# Patient Record
Sex: Female | Born: 1960 | Race: White | Hispanic: No | Marital: Married | Smoking: Never smoker
Health system: Southern US, Community
[De-identification: ages and names within clinical notes are randomized; demographics above are authoritative.]

## PROBLEM LIST (undated history)

## (undated) DIAGNOSIS — I214 Non-ST elevation (NSTEMI) myocardial infarction: Secondary | ICD-10-CM

## (undated) DIAGNOSIS — I10 Essential (primary) hypertension: Secondary | ICD-10-CM

## (undated) DIAGNOSIS — E039 Hypothyroidism, unspecified: Secondary | ICD-10-CM

## (undated) DIAGNOSIS — C50919 Malignant neoplasm of unspecified site of unspecified female breast: Secondary | ICD-10-CM

## (undated) DIAGNOSIS — J189 Pneumonia, unspecified organism: Secondary | ICD-10-CM

## (undated) DIAGNOSIS — C181 Malignant neoplasm of appendix: Secondary | ICD-10-CM

## (undated) DIAGNOSIS — D690 Allergic purpura: Secondary | ICD-10-CM

## (undated) DIAGNOSIS — Z87442 Personal history of urinary calculi: Secondary | ICD-10-CM

## (undated) DIAGNOSIS — R519 Headache, unspecified: Secondary | ICD-10-CM

## (undated) DIAGNOSIS — M810 Age-related osteoporosis without current pathological fracture: Secondary | ICD-10-CM

## (undated) HISTORY — PX: OOPHORECTOMY: SHX86

## (undated) HISTORY — DX: Allergic purpura: D69.0

## (undated) HISTORY — PX: APPENDECTOMY: SHX54

## (undated) HISTORY — DX: Hypothyroidism, unspecified: E03.9

## (undated) HISTORY — DX: Non-ST elevation (NSTEMI) myocardial infarction: I21.4

## (undated) HISTORY — PX: OTHER SURGICAL HISTORY: SHX169

## (undated) HISTORY — DX: Age-related osteoporosis without current pathological fracture: M81.0

---

## 1994-04-30 HISTORY — PX: BREAST BIOPSY: SHX20

## 1998-06-30 ENCOUNTER — Other Ambulatory Visit: Admission: RE | Admit: 1998-06-30 | Discharge: 1998-06-30 | Payer: Self-pay | Admitting: Gynecology

## 1999-03-29 ENCOUNTER — Other Ambulatory Visit: Admission: RE | Admit: 1999-03-29 | Discharge: 1999-03-29 | Payer: Self-pay | Admitting: Gynecology

## 1999-06-12 ENCOUNTER — Encounter: Payer: Self-pay | Admitting: Gynecology

## 1999-06-12 ENCOUNTER — Encounter: Admission: RE | Admit: 1999-06-12 | Discharge: 1999-06-12 | Payer: Self-pay | Admitting: Gynecology

## 1999-06-28 ENCOUNTER — Other Ambulatory Visit: Admission: RE | Admit: 1999-06-28 | Discharge: 1999-06-28 | Payer: Self-pay | Admitting: Gynecology

## 2000-06-13 ENCOUNTER — Encounter: Admission: RE | Admit: 2000-06-13 | Discharge: 2000-06-13 | Payer: Self-pay | Admitting: Gynecology

## 2000-06-13 ENCOUNTER — Encounter: Payer: Self-pay | Admitting: Gynecology

## 2000-07-04 ENCOUNTER — Other Ambulatory Visit: Admission: RE | Admit: 2000-07-04 | Discharge: 2000-07-04 | Payer: Self-pay | Admitting: Gynecology

## 2001-04-30 HISTORY — PX: BREAST LUMPECTOMY: SHX2

## 2001-06-23 ENCOUNTER — Encounter: Admission: RE | Admit: 2001-06-23 | Discharge: 2001-06-23 | Payer: Self-pay | Admitting: Gynecology

## 2001-06-23 ENCOUNTER — Encounter: Payer: Self-pay | Admitting: Gynecology

## 2001-06-26 ENCOUNTER — Other Ambulatory Visit: Admission: RE | Admit: 2001-06-26 | Discharge: 2001-06-26 | Payer: Self-pay | Admitting: Radiology

## 2001-06-26 ENCOUNTER — Encounter: Admission: RE | Admit: 2001-06-26 | Discharge: 2001-06-26 | Payer: Self-pay | Admitting: Gynecology

## 2001-06-26 ENCOUNTER — Encounter: Payer: Self-pay | Admitting: Gynecology

## 2001-06-26 ENCOUNTER — Encounter (INDEPENDENT_AMBULATORY_CARE_PROVIDER_SITE_OTHER): Payer: Self-pay | Admitting: *Deleted

## 2001-06-26 DIAGNOSIS — Z853 Personal history of malignant neoplasm of breast: Secondary | ICD-10-CM

## 2001-07-07 ENCOUNTER — Other Ambulatory Visit: Admission: RE | Admit: 2001-07-07 | Discharge: 2001-07-07 | Payer: Self-pay | Admitting: Gynecology

## 2001-07-07 ENCOUNTER — Encounter: Admission: RE | Admit: 2001-07-07 | Discharge: 2001-07-07 | Payer: Self-pay | Admitting: Surgery

## 2001-07-07 ENCOUNTER — Encounter: Payer: Self-pay | Admitting: Surgery

## 2001-07-10 ENCOUNTER — Encounter (INDEPENDENT_AMBULATORY_CARE_PROVIDER_SITE_OTHER): Payer: Self-pay | Admitting: *Deleted

## 2001-07-10 ENCOUNTER — Ambulatory Visit (HOSPITAL_BASED_OUTPATIENT_CLINIC_OR_DEPARTMENT_OTHER): Admission: RE | Admit: 2001-07-10 | Discharge: 2001-07-10 | Payer: Self-pay | Admitting: Surgery

## 2001-07-10 ENCOUNTER — Encounter: Payer: Self-pay | Admitting: Surgery

## 2001-07-10 ENCOUNTER — Encounter: Admission: RE | Admit: 2001-07-10 | Discharge: 2001-07-10 | Payer: Self-pay | Admitting: Surgery

## 2001-07-22 ENCOUNTER — Ambulatory Visit: Admission: RE | Admit: 2001-07-22 | Discharge: 2001-10-20 | Payer: Self-pay | Admitting: Radiation Oncology

## 2001-08-14 ENCOUNTER — Encounter: Payer: Self-pay | Admitting: Surgery

## 2001-08-14 ENCOUNTER — Ambulatory Visit (HOSPITAL_BASED_OUTPATIENT_CLINIC_OR_DEPARTMENT_OTHER): Admission: RE | Admit: 2001-08-14 | Discharge: 2001-08-14 | Payer: Self-pay | Admitting: Surgery

## 2001-09-16 ENCOUNTER — Inpatient Hospital Stay (HOSPITAL_COMMUNITY): Admission: EM | Admit: 2001-09-16 | Discharge: 2001-09-18 | Payer: Self-pay | Admitting: Oncology

## 2001-10-07 ENCOUNTER — Inpatient Hospital Stay (HOSPITAL_COMMUNITY): Admission: AD | Admit: 2001-10-07 | Discharge: 2001-10-09 | Payer: Self-pay | Admitting: Oncology

## 2001-10-07 ENCOUNTER — Encounter: Payer: Self-pay | Admitting: Oncology

## 2001-10-28 ENCOUNTER — Encounter: Payer: Self-pay | Admitting: Oncology

## 2001-10-28 ENCOUNTER — Inpatient Hospital Stay (HOSPITAL_COMMUNITY): Admission: AD | Admit: 2001-10-28 | Discharge: 2001-10-30 | Payer: Self-pay | Admitting: Oncology

## 2001-11-04 ENCOUNTER — Ambulatory Visit: Admission: RE | Admit: 2001-11-04 | Discharge: 2002-01-22 | Payer: Self-pay | Admitting: Radiation Oncology

## 2001-11-12 ENCOUNTER — Ambulatory Visit (HOSPITAL_BASED_OUTPATIENT_CLINIC_OR_DEPARTMENT_OTHER): Admission: RE | Admit: 2001-11-12 | Discharge: 2001-11-12 | Payer: Self-pay | Admitting: Surgery

## 2001-11-19 ENCOUNTER — Encounter: Admission: RE | Admit: 2001-11-19 | Discharge: 2001-11-19 | Payer: Self-pay | Admitting: Radiation Oncology

## 2001-11-26 ENCOUNTER — Encounter: Admission: RE | Admit: 2001-11-26 | Discharge: 2001-11-26 | Payer: Self-pay | Admitting: Surgery

## 2001-11-26 ENCOUNTER — Encounter: Payer: Self-pay | Admitting: Surgery

## 2001-12-15 ENCOUNTER — Encounter: Admission: RE | Admit: 2001-12-15 | Discharge: 2001-12-15 | Payer: Self-pay | Admitting: Radiation Oncology

## 2002-02-18 ENCOUNTER — Ambulatory Visit: Admission: RE | Admit: 2002-02-18 | Discharge: 2002-02-18 | Payer: Self-pay | Admitting: Radiation Oncology

## 2002-02-19 ENCOUNTER — Ambulatory Visit: Admission: RE | Admit: 2002-02-19 | Discharge: 2002-02-19 | Payer: Self-pay | Admitting: Radiation Oncology

## 2002-07-02 ENCOUNTER — Encounter: Admission: RE | Admit: 2002-07-02 | Discharge: 2002-07-02 | Payer: Self-pay | Admitting: Oncology

## 2002-07-02 ENCOUNTER — Encounter: Payer: Self-pay | Admitting: Oncology

## 2002-07-29 ENCOUNTER — Other Ambulatory Visit: Admission: RE | Admit: 2002-07-29 | Discharge: 2002-07-29 | Payer: Self-pay | Admitting: Gynecology

## 2003-01-18 ENCOUNTER — Encounter: Payer: Self-pay | Admitting: Oncology

## 2003-01-18 ENCOUNTER — Encounter: Admission: RE | Admit: 2003-01-18 | Discharge: 2003-01-18 | Payer: Self-pay | Admitting: Oncology

## 2003-07-08 ENCOUNTER — Encounter: Admission: RE | Admit: 2003-07-08 | Discharge: 2003-07-08 | Payer: Self-pay | Admitting: Oncology

## 2003-07-30 ENCOUNTER — Other Ambulatory Visit: Admission: RE | Admit: 2003-07-30 | Discharge: 2003-07-30 | Payer: Self-pay | Admitting: Gynecology

## 2004-05-31 ENCOUNTER — Ambulatory Visit: Payer: Self-pay | Admitting: Internal Medicine

## 2004-07-10 ENCOUNTER — Encounter: Admission: RE | Admit: 2004-07-10 | Discharge: 2004-07-10 | Payer: Self-pay | Admitting: Oncology

## 2004-07-13 ENCOUNTER — Ambulatory Visit: Payer: Self-pay | Admitting: Oncology

## 2004-07-27 ENCOUNTER — Encounter: Admission: RE | Admit: 2004-07-27 | Discharge: 2004-07-27 | Payer: Self-pay | Admitting: Oncology

## 2004-08-01 ENCOUNTER — Other Ambulatory Visit: Admission: RE | Admit: 2004-08-01 | Discharge: 2004-08-01 | Payer: Self-pay | Admitting: Gynecology

## 2004-08-08 ENCOUNTER — Ambulatory Visit (HOSPITAL_COMMUNITY): Admission: RE | Admit: 2004-08-08 | Discharge: 2004-08-08 | Payer: Self-pay | Admitting: Oncology

## 2004-10-18 ENCOUNTER — Ambulatory Visit: Payer: Self-pay | Admitting: Internal Medicine

## 2004-11-16 ENCOUNTER — Ambulatory Visit: Payer: Self-pay | Admitting: Internal Medicine

## 2005-01-22 ENCOUNTER — Ambulatory Visit: Payer: Self-pay | Admitting: Oncology

## 2005-03-21 ENCOUNTER — Ambulatory Visit: Payer: Self-pay | Admitting: Internal Medicine

## 2005-07-11 ENCOUNTER — Encounter: Admission: RE | Admit: 2005-07-11 | Discharge: 2005-07-11 | Payer: Self-pay | Admitting: Oncology

## 2005-08-06 ENCOUNTER — Other Ambulatory Visit: Admission: RE | Admit: 2005-08-06 | Discharge: 2005-08-06 | Payer: Self-pay | Admitting: Gynecology

## 2005-08-08 ENCOUNTER — Ambulatory Visit: Payer: Self-pay | Admitting: Oncology

## 2005-08-08 LAB — CBC WITH DIFFERENTIAL/PLATELET
Basophils Absolute: 0 10*3/uL (ref 0.0–0.1)
Eosinophils Absolute: 0.1 10*3/uL (ref 0.0–0.5)
HGB: 11.5 g/dL — ABNORMAL LOW (ref 11.6–15.9)
MCV: 77.2 fL — ABNORMAL LOW (ref 81.0–101.0)
MONO%: 8.1 % (ref 0.0–13.0)
NEUT#: 2 10*3/uL (ref 1.5–6.5)
Platelets: 223 10*3/uL (ref 145–400)
RDW: 19 % — ABNORMAL HIGH (ref 11.3–14.5)

## 2005-08-08 LAB — COMPREHENSIVE METABOLIC PANEL
Albumin: 4.2 g/dL (ref 3.5–5.2)
CO2: 25 mEq/L (ref 19–32)
Calcium: 9 mg/dL (ref 8.4–10.5)
Chloride: 107 mEq/L (ref 96–112)
Glucose, Bld: 97 mg/dL (ref 70–99)
Potassium: 4.4 mEq/L (ref 3.5–5.3)
Sodium: 140 mEq/L (ref 135–145)
Total Bilirubin: 0.5 mg/dL (ref 0.3–1.2)
Total Protein: 7.1 g/dL (ref 6.0–8.3)

## 2005-08-08 LAB — LIPID PANEL: Cholesterol: 195 mg/dL (ref 0–200)

## 2005-08-08 LAB — URINALYSIS, MICROSCOPIC - CHCC
Bilirubin (Urine): NEGATIVE
Blood: NEGATIVE
Leukocyte Esterase: NEGATIVE
pH: 6.5 (ref 4.6–8.0)

## 2005-08-08 LAB — TSH: TSH: 3.105 u[IU]/mL (ref 0.350–5.500)

## 2005-08-30 ENCOUNTER — Encounter (INDEPENDENT_AMBULATORY_CARE_PROVIDER_SITE_OTHER): Payer: Self-pay | Admitting: Specialist

## 2005-08-30 ENCOUNTER — Ambulatory Visit (HOSPITAL_BASED_OUTPATIENT_CLINIC_OR_DEPARTMENT_OTHER): Admission: RE | Admit: 2005-08-30 | Discharge: 2005-08-30 | Payer: Self-pay | Admitting: Gynecology

## 2005-10-01 ENCOUNTER — Ambulatory Visit: Payer: Self-pay | Admitting: Oncology

## 2005-10-03 LAB — CBC WITH DIFFERENTIAL/PLATELET
Basophils Absolute: 0 10*3/uL (ref 0.0–0.1)
EOS%: 6.2 % (ref 0.0–7.0)
Eosinophils Absolute: 0.3 10*3/uL (ref 0.0–0.5)
HGB: 12 g/dL (ref 11.6–15.9)
NEUT#: 2.4 10*3/uL (ref 1.5–6.5)
RDW: 17.6 % — ABNORMAL HIGH (ref 11.3–14.5)
WBC: 4.8 10*3/uL (ref 3.9–10.0)
lymph#: 1.6 10*3/uL (ref 0.9–3.3)

## 2005-11-14 ENCOUNTER — Encounter: Admission: RE | Admit: 2005-11-14 | Discharge: 2005-11-14 | Payer: Self-pay | Admitting: Oncology

## 2005-12-03 ENCOUNTER — Ambulatory Visit: Payer: Self-pay | Admitting: Oncology

## 2005-12-05 LAB — CBC WITH DIFFERENTIAL/PLATELET
Basophils Absolute: 0 10*3/uL (ref 0.0–0.1)
EOS%: 4.1 % (ref 0.0–7.0)
Eosinophils Absolute: 0.2 10*3/uL (ref 0.0–0.5)
HCT: 38.4 % (ref 34.8–46.6)
HGB: 13.1 g/dL (ref 11.6–15.9)
MCH: 28.5 pg (ref 26.0–34.0)
MONO#: 0.3 10*3/uL (ref 0.1–0.9)
NEUT%: 54.2 % (ref 39.6–76.8)
lymph#: 1.3 10*3/uL (ref 0.9–3.3)

## 2005-12-07 ENCOUNTER — Ambulatory Visit: Payer: Self-pay | Admitting: Internal Medicine

## 2006-04-01 ENCOUNTER — Ambulatory Visit: Payer: Self-pay | Admitting: Oncology

## 2006-04-03 LAB — CBC WITH DIFFERENTIAL/PLATELET
BASO%: 0.4 % (ref 0.0–2.0)
Basophils Absolute: 0 10*3/uL (ref 0.0–0.1)
HCT: 38.3 % (ref 34.8–46.6)
HGB: 13.1 g/dL (ref 11.6–15.9)
MCHC: 34.2 g/dL (ref 32.0–36.0)
MONO#: 0.5 10*3/uL (ref 0.1–0.9)
NEUT%: 52.8 % (ref 39.6–76.8)
WBC: 5.8 10*3/uL (ref 3.9–10.0)
lymph#: 2 10*3/uL (ref 0.9–3.3)

## 2006-04-30 HISTORY — PX: TOTAL ABDOMINAL HYSTERECTOMY: SHX209

## 2006-05-31 ENCOUNTER — Ambulatory Visit: Payer: Self-pay | Admitting: Internal Medicine

## 2006-06-24 ENCOUNTER — Ambulatory Visit (HOSPITAL_COMMUNITY): Admission: RE | Admit: 2006-06-24 | Discharge: 2006-06-24 | Payer: Self-pay | Admitting: Gynecology

## 2006-07-15 ENCOUNTER — Encounter: Admission: RE | Admit: 2006-07-15 | Discharge: 2006-07-15 | Payer: Self-pay | Admitting: Oncology

## 2006-07-19 ENCOUNTER — Encounter: Admission: RE | Admit: 2006-07-19 | Discharge: 2006-07-19 | Payer: Self-pay | Admitting: Oncology

## 2006-08-12 ENCOUNTER — Encounter (INDEPENDENT_AMBULATORY_CARE_PROVIDER_SITE_OTHER): Payer: Self-pay | Admitting: Specialist

## 2006-08-12 ENCOUNTER — Ambulatory Visit (HOSPITAL_COMMUNITY): Admission: RE | Admit: 2006-08-12 | Discharge: 2006-08-13 | Payer: Self-pay | Admitting: Gynecology

## 2006-09-30 ENCOUNTER — Ambulatory Visit: Payer: Self-pay | Admitting: Oncology

## 2006-10-02 LAB — COMPREHENSIVE METABOLIC PANEL
ALT: 11 U/L (ref 0–35)
Albumin: 4.3 g/dL (ref 3.5–5.2)
CO2: 26 mEq/L (ref 19–32)
Calcium: 9.6 mg/dL (ref 8.4–10.5)
Chloride: 106 mEq/L (ref 96–112)
Glucose, Bld: 87 mg/dL (ref 70–99)
Potassium: 3.9 mEq/L (ref 3.5–5.3)
Sodium: 142 mEq/L (ref 135–145)
Total Protein: 7.3 g/dL (ref 6.0–8.3)

## 2006-10-02 LAB — CBC WITH DIFFERENTIAL/PLATELET
BASO%: 1.2 % (ref 0.0–2.0)
Eosinophils Absolute: 0.2 10*3/uL (ref 0.0–0.5)
LYMPH%: 42.9 % (ref 14.0–48.0)
MCHC: 34.7 g/dL (ref 32.0–36.0)
MONO#: 0.5 10*3/uL (ref 0.1–0.9)
NEUT#: 2.6 10*3/uL (ref 1.5–6.5)
Platelets: 245 10*3/uL (ref 145–400)
RBC: 4.46 10*6/uL (ref 3.70–5.32)
WBC: 5.9 10*3/uL (ref 3.9–10.0)
lymph#: 2.5 10*3/uL (ref 0.9–3.3)

## 2006-12-17 ENCOUNTER — Ambulatory Visit: Payer: Self-pay | Admitting: Internal Medicine

## 2007-01-02 ENCOUNTER — Ambulatory Visit: Payer: Self-pay | Admitting: Internal Medicine

## 2007-03-31 ENCOUNTER — Ambulatory Visit: Payer: Self-pay | Admitting: Oncology

## 2007-04-02 LAB — COMPREHENSIVE METABOLIC PANEL
ALT: 10 U/L (ref 0–35)
Albumin: 4.3 g/dL (ref 3.5–5.2)
Alkaline Phosphatase: 84 U/L (ref 39–117)
CO2: 24 mEq/L (ref 19–32)
Glucose, Bld: 88 mg/dL (ref 70–99)
Potassium: 3.9 mEq/L (ref 3.5–5.3)
Sodium: 140 mEq/L (ref 135–145)
Total Bilirubin: 0.4 mg/dL (ref 0.3–1.2)
Total Protein: 7.4 g/dL (ref 6.0–8.3)

## 2007-04-02 LAB — CBC WITH DIFFERENTIAL/PLATELET
BASO%: 0.8 % (ref 0.0–2.0)
Eosinophils Absolute: 0.2 10*3/uL (ref 0.0–0.5)
LYMPH%: 43.5 % (ref 14.0–48.0)
MCHC: 34.9 g/dL (ref 32.0–36.0)
MONO#: 0.5 10*3/uL (ref 0.1–0.9)
NEUT#: 2.2 10*3/uL (ref 1.5–6.5)
Platelets: 228 10*3/uL (ref 145–400)
RBC: 4.55 10*6/uL (ref 3.70–5.32)
RDW: 13.8 % (ref 11.3–14.5)
WBC: 5.2 10*3/uL (ref 3.9–10.0)

## 2007-07-18 ENCOUNTER — Other Ambulatory Visit: Admission: RE | Admit: 2007-07-18 | Discharge: 2007-07-18 | Payer: Self-pay | Admitting: Diagnostic Radiology

## 2007-07-18 ENCOUNTER — Encounter (INDEPENDENT_AMBULATORY_CARE_PROVIDER_SITE_OTHER): Payer: Self-pay | Admitting: Diagnostic Radiology

## 2007-07-18 ENCOUNTER — Encounter: Admission: RE | Admit: 2007-07-18 | Discharge: 2007-07-18 | Payer: Self-pay | Admitting: Oncology

## 2007-08-13 ENCOUNTER — Ambulatory Visit: Payer: Self-pay | Admitting: Internal Medicine

## 2007-09-29 ENCOUNTER — Ambulatory Visit: Payer: Self-pay | Admitting: Oncology

## 2007-10-01 LAB — CBC WITH DIFFERENTIAL/PLATELET
Basophils Absolute: 0 10*3/uL (ref 0.0–0.1)
HCT: 39.7 % (ref 34.8–46.6)
HGB: 13.4 g/dL (ref 11.6–15.9)
MONO#: 0.5 10*3/uL (ref 0.1–0.9)
NEUT%: 55.2 % (ref 39.6–76.8)
WBC: 5.1 10*3/uL (ref 3.9–10.0)
lymph#: 1.6 10*3/uL (ref 0.9–3.3)

## 2007-10-01 LAB — COMPREHENSIVE METABOLIC PANEL
BUN: 11 mg/dL (ref 6–23)
CO2: 23 mEq/L (ref 19–32)
Calcium: 9.6 mg/dL (ref 8.4–10.5)
Chloride: 106 mEq/L (ref 96–112)
Creatinine, Ser: 0.75 mg/dL (ref 0.40–1.20)
Glucose, Bld: 89 mg/dL (ref 70–99)

## 2007-12-16 ENCOUNTER — Ambulatory Visit: Payer: Self-pay | Admitting: Internal Medicine

## 2008-01-12 ENCOUNTER — Encounter: Admission: RE | Admit: 2008-01-12 | Discharge: 2008-01-12 | Payer: Self-pay | Admitting: Oncology

## 2008-04-05 ENCOUNTER — Ambulatory Visit: Payer: Self-pay | Admitting: Oncology

## 2008-04-07 LAB — CBC WITH DIFFERENTIAL/PLATELET
EOS%: 3.6 % (ref 0.0–7.0)
Eosinophils Absolute: 0.2 10*3/uL (ref 0.0–0.5)
MCV: 85.2 fL (ref 81.0–101.0)
MONO%: 7.6 % (ref 0.0–13.0)
NEUT#: 2.1 10*3/uL (ref 1.5–6.5)
RBC: 4.44 10*6/uL (ref 3.70–5.32)
RDW: 14.6 % — ABNORMAL HIGH (ref 11.3–14.5)
WBC: 4.6 10*3/uL (ref 3.9–10.0)

## 2008-04-07 LAB — COMPREHENSIVE METABOLIC PANEL
AST: 15 U/L (ref 0–37)
BUN: 11 mg/dL (ref 6–23)
CO2: 27 mEq/L (ref 19–32)
Calcium: 9.2 mg/dL (ref 8.4–10.5)
Chloride: 105 mEq/L (ref 96–112)
Creatinine, Ser: 0.77 mg/dL (ref 0.40–1.20)

## 2008-04-21 ENCOUNTER — Ambulatory Visit: Payer: Self-pay | Admitting: Internal Medicine

## 2008-04-21 DIAGNOSIS — J209 Acute bronchitis, unspecified: Secondary | ICD-10-CM

## 2008-07-12 ENCOUNTER — Telehealth (INDEPENDENT_AMBULATORY_CARE_PROVIDER_SITE_OTHER): Payer: Self-pay | Admitting: *Deleted

## 2008-07-13 ENCOUNTER — Ambulatory Visit: Payer: Self-pay | Admitting: Internal Medicine

## 2008-07-21 ENCOUNTER — Encounter: Admission: RE | Admit: 2008-07-21 | Discharge: 2008-07-21 | Payer: Self-pay | Admitting: Oncology

## 2008-08-09 ENCOUNTER — Ambulatory Visit: Payer: Self-pay | Admitting: Internal Medicine

## 2008-08-18 ENCOUNTER — Encounter: Admission: RE | Admit: 2008-08-18 | Discharge: 2008-08-18 | Payer: Self-pay | Admitting: Oncology

## 2008-09-23 ENCOUNTER — Ambulatory Visit: Payer: Self-pay | Admitting: Oncology

## 2008-09-28 LAB — CBC WITH DIFFERENTIAL/PLATELET
Eosinophils Absolute: 0.1 10*3/uL (ref 0.0–0.5)
MONO#: 0.4 10*3/uL (ref 0.1–0.9)
NEUT#: 2.5 10*3/uL (ref 1.5–6.5)
RBC: 4.62 10*6/uL (ref 3.70–5.45)
RDW: 14.4 % (ref 11.2–14.5)
WBC: 4.8 10*3/uL (ref 3.9–10.3)
lymph#: 1.8 10*3/uL (ref 0.9–3.3)

## 2008-09-28 LAB — COMPREHENSIVE METABOLIC PANEL
Albumin: 4.1 g/dL (ref 3.5–5.2)
Alkaline Phosphatase: 95 U/L (ref 39–117)
CO2: 24 mEq/L (ref 19–32)
Chloride: 105 mEq/L (ref 96–112)
Glucose, Bld: 98 mg/dL (ref 70–99)
Potassium: 4.2 mEq/L (ref 3.5–5.3)
Sodium: 141 mEq/L (ref 135–145)
Total Protein: 7.1 g/dL (ref 6.0–8.3)

## 2009-04-18 ENCOUNTER — Ambulatory Visit: Payer: Self-pay | Admitting: Oncology

## 2009-04-20 LAB — CBC WITH DIFFERENTIAL/PLATELET
BASO%: 0.5 % (ref 0.0–2.0)
Basophils Absolute: 0 10e3/uL (ref 0.0–0.1)
EOS%: 2.6 % (ref 0.0–7.0)
Eosinophils Absolute: 0.1 10e3/uL (ref 0.0–0.5)
HCT: 41.5 % (ref 34.8–46.6)
HGB: 14 g/dL (ref 11.6–15.9)
LYMPH%: 33.1 % (ref 14.0–49.7)
MCH: 29.1 pg (ref 25.1–34.0)
MCHC: 33.8 g/dL (ref 31.5–36.0)
MCV: 86.1 fL (ref 79.5–101.0)
MONO#: 0.5 10e3/uL (ref 0.1–0.9)
MONO%: 9.2 % (ref 0.0–14.0)
NEUT#: 2.8 10e3/uL (ref 1.5–6.5)
NEUT%: 54.6 % (ref 38.4–76.8)
Platelets: 231 10e3/uL (ref 145–400)
RBC: 4.82 10e6/uL (ref 3.70–5.45)
RDW: 14.4 % (ref 11.2–14.5)
WBC: 5.2 10e3/uL (ref 3.9–10.3)
lymph#: 1.7 10e3/uL (ref 0.9–3.3)

## 2009-04-20 LAB — LIPID PANEL
Total CHOL/HDL Ratio: 3.7 Ratio
VLDL: 26 mg/dL (ref 0–40)

## 2009-04-20 LAB — COMPREHENSIVE METABOLIC PANEL WITH GFR
ALT: 15 U/L (ref 0–35)
AST: 15 U/L (ref 0–37)
Albumin: 4.2 g/dL (ref 3.5–5.2)
Alkaline Phosphatase: 101 U/L (ref 39–117)
BUN: 14 mg/dL (ref 6–23)
CO2: 23 meq/L (ref 19–32)
Calcium: 9.6 mg/dL (ref 8.4–10.5)
Chloride: 104 meq/L (ref 96–112)
Creatinine, Ser: 0.74 mg/dL (ref 0.40–1.20)
Glucose, Bld: 101 mg/dL — ABNORMAL HIGH (ref 70–99)
Potassium: 4.3 meq/L (ref 3.5–5.3)
Sodium: 140 meq/L (ref 135–145)
Total Bilirubin: 0.5 mg/dL (ref 0.3–1.2)
Total Protein: 7.4 g/dL (ref 6.0–8.3)

## 2009-07-22 ENCOUNTER — Encounter: Admission: RE | Admit: 2009-07-22 | Discharge: 2009-07-22 | Payer: Self-pay | Admitting: Oncology

## 2009-08-16 ENCOUNTER — Ambulatory Visit: Payer: Self-pay | Admitting: Internal Medicine

## 2009-10-13 ENCOUNTER — Ambulatory Visit: Payer: Self-pay | Admitting: Oncology

## 2009-10-17 LAB — CBC WITH DIFFERENTIAL/PLATELET
BASO%: 0.5 % (ref 0.0–2.0)
Basophils Absolute: 0 10*3/uL (ref 0.0–0.1)
EOS%: 2.8 % (ref 0.0–7.0)
HCT: 37.9 % (ref 34.8–46.6)
HGB: 13 g/dL (ref 11.6–15.9)
MONO#: 0.5 10*3/uL (ref 0.1–0.9)
MONO%: 8.2 % (ref 0.0–14.0)
NEUT#: 3.5 10*3/uL (ref 1.5–6.5)
NEUT%: 60 % (ref 38.4–76.8)
RBC: 4.41 10*6/uL (ref 3.70–5.45)
RDW: 13.8 % (ref 11.2–14.5)

## 2009-10-17 LAB — COMPREHENSIVE METABOLIC PANEL
Alkaline Phosphatase: 87 U/L (ref 39–117)
Chloride: 106 mEq/L (ref 96–112)
Creatinine, Ser: 1.02 mg/dL (ref 0.40–1.20)
Potassium: 4.2 mEq/L (ref 3.5–5.3)
Sodium: 140 mEq/L (ref 135–145)
Total Bilirubin: 0.3 mg/dL (ref 0.3–1.2)

## 2010-04-03 ENCOUNTER — Ambulatory Visit: Payer: Self-pay | Admitting: Oncology

## 2010-04-04 LAB — CBC WITH DIFFERENTIAL/PLATELET
BASO%: 0.7 % (ref 0.0–2.0)
HCT: 39.8 % (ref 34.8–46.6)
HGB: 13.6 g/dL (ref 11.6–15.9)
LYMPH%: 32.3 % (ref 14.0–49.7)
MCV: 84.1 fL (ref 79.5–101.0)
MONO#: 0.4 10*3/uL (ref 0.1–0.9)
NEUT#: 2.8 10*3/uL (ref 1.5–6.5)
Platelets: 226 10*3/uL (ref 145–400)
RBC: 4.73 10*6/uL (ref 3.70–5.45)
RDW: 14.3 % (ref 11.2–14.5)
lymph#: 1.6 10*3/uL (ref 0.9–3.3)

## 2010-04-04 LAB — COMPREHENSIVE METABOLIC PANEL
ALT: 12 U/L (ref 0–35)
AST: 14 U/L (ref 0–37)
Alkaline Phosphatase: 100 U/L (ref 39–117)
BUN: 12 mg/dL (ref 6–23)
CO2: 27 mEq/L (ref 19–32)
Calcium: 9 mg/dL (ref 8.4–10.5)
Chloride: 105 mEq/L (ref 96–112)
Creatinine, Ser: 0.76 mg/dL (ref 0.40–1.20)
Glucose, Bld: 100 mg/dL — ABNORMAL HIGH (ref 70–99)
Sodium: 139 mEq/L (ref 135–145)
Total Bilirubin: 0.5 mg/dL (ref 0.3–1.2)

## 2010-05-20 ENCOUNTER — Other Ambulatory Visit: Payer: Self-pay | Admitting: Oncology

## 2010-05-20 DIAGNOSIS — Z1239 Encounter for other screening for malignant neoplasm of breast: Secondary | ICD-10-CM

## 2010-06-01 NOTE — Assessment & Plan Note (Signed)
Summary: 12 months/apc   Primary Provider/Referring Provider:  Kindl  CC:  Yearly follow up visit-couple of episodes over the past year of breathing troubles. .  History of Present Illness: April 21, 2008 --c/o sinus congestion with clear drainage, sinus pressure, PND, sore throat, prod cough with yellow mucous since 04-04-08 - has finished zpak and OTC therapy-still not better.Finished zpack on 12/13. Stopped allergy vaccine 7/09.   07/13/08- Allergic rhinitis, bronchitis Was well through rest of winter after December visit. 4 days ago onset sore throat, shifting nasal congestion, sneezing, coughing up yellow. No fever. Right ear whistles when she blows her nose.  gi ok. Avoided nasal steroid for maintenance.  08/08/08- Allergic rhinitis, bronchitis After treatment last time, she did start alavert and nasonex. She feels much better and husband tells her she is snoring much less.  Chest comfortable and she is pleased with her status. Airconditioning at home also helps.  August 16, 2009 Allergic rhinitis, bronchitis Mainly uncomfortable with nasal congestion lying down x 4 weeks. Maxillary headaches. Little mucus from nose and  not much cough. Using Nasonex and Claritin. Not sneezing or wheezing.    Current Medications (verified): 1)  Calcium Carbonate 600 Mg  Tabs (Calcium Carbonate) .... Take 1 Two Times A Day 2)  Multivitamins   Tabs (Multiple Vitamin) .... Take 1 By Mouth Once Daily 3)  Vitamin E 400 Unit  Caps (Vitamin E) .... Take 1 By Mouth Once Daily 4)  Magnesium Gluconate 500 Mg  Tabs (Magnesium Gluconate) .... Take 1000mg  Daily 5)  Vitamin D 2000 Unit  Tabs (Cholecalciferol) .... Take 3000mg  Once Daily 6)  Arimidex 1 Mg  Tabs (Anastrozole) .... Take 1 By Mouth Once Daily 7)  Alavert 10 Mg  Tabs (Loratadine) .... Take 1 Tablet By Mouth Once A Day As Needed 8)  Nasonex 50 Mcg/act Susp (Mometasone Furoate) .Marland Kitchen.. 1-2 Sprays Each Nostril Daily  Allergies (verified): 1)  ! Pcn 2)   ! * Right Arm Only For Blood Pressure Readings  Past History:  Past Medical History: Last updated: 12/16/2007 Breast cancer- left lumpectomy chemo/ xrt 2003 Allergic rhinitis  Past Surgical History: Last updated: 12/16/2007 left breast lumpectomy TAH 2008- has appendix  Family History: Last updated: Dec 23, 2007 Mother-deceased age 42; breast cancer; DM, Allergies,Clotting Disorder, Arthritis. Father- deceased age 69; Vehicle accident Sibling 1- deceased age 60; heart attack Sibling 2- living age 52; stroke (2)  Social History: Last updated: 12/23/2007 Patient never smoked.  Positive history of passive tobacco smoke exposure.  Exercise- walk 7 times week Caffeine- 2-3 cups daily. ETOH-2 glasses per month Married with 1 child.  Risk Factors: Smoking Status: never (2007/12/23) Passive Smoke Exposure: yes (23-Dec-2007)  Review of Systems      See HPI       The patient complains of headaches.  The patient denies anorexia, fever, weight loss, weight gain, vision loss, decreased hearing, hoarseness, chest pain, syncope, dyspnea on exertion, peripheral edema, prolonged cough, hemoptysis, and severe indigestion/heartburn.    Vital Signs:  Patient profile:   50 year old female Height:      67 inches Weight:      215.13 pounds BMI:     33.82 O2 Sat:      95 % on Room air Pulse rate:   96 / minute BP sitting:   136 / 90  (left arm) Cuff size:   large  Vitals Entered By: Reynaldo Minium CMA (August 16, 2009 9:55 AM)  O2 Flow:  Room air  Physical  Exam  Additional Exam:  General: A/Ox3; pleasant and cooperative, NAD, SKIN: no rash, lesions NODES: no lymphadenopathy HEENT: [place]/AT, EOM-wnl, Conjunctivae clear, watery, PERRLA, TMs- WNL, Nose- pale turbinate edema, mucus bridging, Throat- Mallampati  III NECK: Supple w/ fair ROM, JVD- none, normal carotid impulses w/o bruits ,   CHEST: Coarse BS bilaterally, no wheezing HEART: RRR, no m/g/r heard Abdomen: soft NEURO: Grossly  intact to observation      Impression & Recommendations:  Problem # 1:  ALLERGIC RHINITIS (ICD-477.9)  Seasonal allergic rhinitis. She is cautious using decongestants and steroids which we discussed. Her updated medication list for this problem includes:    Alavert 10 Mg Tabs (Loratadine) .Marland Kitchen... Take 1 tablet by mouth once a day as needed    Nasonex 50 Mcg/act Susp (Mometasone furoate) .Marland Kitchen... 1-2 sprays each nostril daily    Astepro 0.15 % Soln (Azelastine hcl) .Marland Kitchen... 1-2 sprays each nostril up to twice daily as needed  Medications Added to Medication List This Visit: 1)  Vitamin D 2000 Unit Tabs (Cholecalciferol) .... Take 3000mg  once daily 2)  Astepro 0.15 % Soln (Azelastine hcl) .Marland Kitchen.. 1-2 sprays each nostril up to twice daily as needed  Other Orders: Est. Patient Level III (16109)  Patient Instructions: 1)  Please schedule a follow-up appointment in 1 year. 2)  Sample/ script Astepro nasal antihistamine spray:  3)  1-2 puffs each nostril up to twice daily if needed. 4)  Keep on using Nasonex. During allergy season you can try using it twicde daily. 5)  You can use a decongestant like Sudafed or Sudafed-PE (otc) as needed Prescriptions: NASONEX 50 MCG/ACT SUSP (MOMETASONE FUROATE) 1-2 sprays each nostril daily  #1 x prn   Entered and Authorized by:   Waymon Budge MD   Signed by:   Waymon Budge MD on 08/16/2009   Method used:   Print then Give to Patient   RxID:   [id number] ASTEPRO 0.15 % SOLN (AZELASTINE HCL) 1-2 sprays each nostril up to twice daily as needed  #1 x prn   Entered and Authorized by:   Waymon Budge MD   Signed by:   Waymon Budge MD on 08/16/2009   Method used:   Print then Give to Patient   RxID:   [id number]

## 2010-06-22 ENCOUNTER — Emergency Department (HOSPITAL_BASED_OUTPATIENT_CLINIC_OR_DEPARTMENT_OTHER)
Admission: EM | Admit: 2010-06-22 | Discharge: 2010-06-23 | Payer: 59 | Source: Home / Self Care | Attending: Emergency Medicine | Admitting: Emergency Medicine

## 2010-06-22 DIAGNOSIS — Z853 Personal history of malignant neoplasm of breast: Secondary | ICD-10-CM | POA: Insufficient documentation

## 2010-06-22 DIAGNOSIS — R0609 Other forms of dyspnea: Secondary | ICD-10-CM | POA: Insufficient documentation

## 2010-06-22 DIAGNOSIS — R0989 Other specified symptoms and signs involving the circulatory and respiratory systems: Secondary | ICD-10-CM | POA: Insufficient documentation

## 2010-06-22 DIAGNOSIS — R079 Chest pain, unspecified: Secondary | ICD-10-CM

## 2010-06-22 DIAGNOSIS — Z8249 Family history of ischemic heart disease and other diseases of the circulatory system: Secondary | ICD-10-CM | POA: Insufficient documentation

## 2010-06-22 DIAGNOSIS — R110 Nausea: Secondary | ICD-10-CM | POA: Insufficient documentation

## 2010-06-22 DIAGNOSIS — R0602 Shortness of breath: Secondary | ICD-10-CM | POA: Insufficient documentation

## 2010-06-22 DIAGNOSIS — R61 Generalized hyperhidrosis: Secondary | ICD-10-CM | POA: Insufficient documentation

## 2010-06-23 ENCOUNTER — Inpatient Hospital Stay (HOSPITAL_COMMUNITY)
Admission: AD | Admit: 2010-06-23 | Discharge: 2010-06-24 | DRG: 282 | Disposition: A | Discharge status: Home / Self Care | Payer: 59 | Source: Other Acute Inpatient Hospital | Attending: Cardiology | Admitting: Cardiology

## 2010-06-23 ENCOUNTER — Inpatient Hospital Stay (HOSPITAL_COMMUNITY): Payer: 59

## 2010-06-23 ENCOUNTER — Emergency Department (INDEPENDENT_AMBULATORY_CARE_PROVIDER_SITE_OTHER): Payer: 59

## 2010-06-23 ENCOUNTER — Encounter (HOSPITAL_COMMUNITY): Payer: Self-pay | Admitting: Radiology

## 2010-06-23 DIAGNOSIS — R079 Chest pain, unspecified: Secondary | ICD-10-CM

## 2010-06-23 DIAGNOSIS — E785 Hyperlipidemia, unspecified: Secondary | ICD-10-CM | POA: Diagnosis present

## 2010-06-23 DIAGNOSIS — I10 Essential (primary) hypertension: Secondary | ICD-10-CM | POA: Diagnosis present

## 2010-06-23 DIAGNOSIS — R0602 Shortness of breath: Secondary | ICD-10-CM

## 2010-06-23 DIAGNOSIS — I214 Non-ST elevation (NSTEMI) myocardial infarction: Principal | ICD-10-CM | POA: Diagnosis present

## 2010-06-23 DIAGNOSIS — I201 Angina pectoris with documented spasm: Secondary | ICD-10-CM | POA: Diagnosis present

## 2010-06-23 DIAGNOSIS — Z853 Personal history of malignant neoplasm of breast: Secondary | ICD-10-CM

## 2010-06-23 HISTORY — DX: Malignant neoplasm of unspecified site of unspecified female breast: C50.919

## 2010-06-23 LAB — COMPREHENSIVE METABOLIC PANEL
AST: 26 U/L (ref 0–37)
Albumin: 3.3 g/dL — ABNORMAL LOW (ref 3.5–5.2)
BUN: 12 mg/dL (ref 6–23)
CO2: 25 mEq/L (ref 19–32)
Calcium: 8.9 mg/dL (ref 8.4–10.5)
Creatinine, Ser: 0.78 mg/dL (ref 0.4–1.2)
GFR calc Af Amer: >60 mL/min (ref >60)
GFR calc non Af Amer: >60 mL/min (ref >60)

## 2010-06-23 LAB — HEPARIN LEVEL (UNFRACTIONATED): Heparin Unfractionated: <0.1 IU/mL — ABNORMAL LOW (ref 0.30–0.70)

## 2010-06-23 LAB — CBC
HCT: 36.4 % (ref 36.0–46.0)
Hemoglobin: 11.9 g/dL — ABNORMAL LOW (ref 12.0–15.0)
MCH: 28.1 pg (ref 26.0–34.0)
MCHC: 33.8 g/dL (ref 30.0–36.0)
Platelets: 222 10*3/uL (ref 150–400)
RDW: 13.9 % (ref 11.5–15.5)
WBC: 7.1 10*3/uL (ref 4.0–10.5)
WBC: 8.7 10*3/uL (ref 4.0–10.5)

## 2010-06-23 LAB — POCT TOXICOLOGY PANEL

## 2010-06-23 LAB — URINALYSIS, ROUTINE W REFLEX MICROSCOPIC
Protein, ur: NEGATIVE mg/dL
Specific Gravity, Urine: 1.017 (ref 1.005–1.030)
Urine Glucose, Fasting: NEGATIVE mg/dL
pH: 7 (ref 5.0–8.0)

## 2010-06-23 LAB — URINE MICROSCOPIC-ADD ON

## 2010-06-23 LAB — CARDIAC PANEL(CRET KIN+CKTOT+MB+TROPI)
CK, MB: 27.2 ng/mL (ref 0.3–4.0)
CK, MB: 94.8 ng/mL (ref 0.3–4.0)
Relative Index: 11.7 — ABNORMAL HIGH (ref 0.0–2.5)
Relative Index: 13.3 — ABNORMAL HIGH (ref 0.0–2.5)
Total CK: 232 U/L — ABNORMAL HIGH (ref 7–177)
Total CK: 711 U/L — ABNORMAL HIGH (ref 7–177)

## 2010-06-23 LAB — PROTIME-INR
INR: 1.03 (ref 0.00–1.49)
Prothrombin Time: 13.7 seconds (ref 11.6–15.2)

## 2010-06-23 LAB — DIFFERENTIAL
Basophils Relative: 1 % (ref 0–1)
Eosinophils Absolute: 0.2 10*3/uL (ref 0.0–0.7)
Lymphocytes Relative: 41 % (ref 12–46)
Monocytes Absolute: 0.7 10*3/uL (ref 0.1–1.0)

## 2010-06-23 LAB — POCT CARDIAC MARKERS: Myoglobin, poc: 39.5 ng/mL (ref 12–200)

## 2010-06-23 LAB — BASIC METABOLIC PANEL
CO2: 23 mEq/L (ref 19–32)
GFR calc Af Amer: >60 mL/min (ref >60)
GFR calc non Af Amer: >60 mL/min (ref >60)
Glucose, Bld: 125 mg/dL — ABNORMAL HIGH (ref 70–99)

## 2010-06-23 LAB — BRAIN NATRIURETIC PEPTIDE: Pro B Natriuretic peptide (BNP): 38 pg/mL (ref 0.0–100.0)

## 2010-06-23 LAB — D-DIMER, QUANTITATIVE: D-Dimer, Quant: 0.24 ug/mL-FEU (ref 0.00–0.48)

## 2010-06-23 LAB — LIPID PANEL
Triglycerides: 70 mg/dL (ref <150)
VLDL: 14 mg/dL (ref 0–40)

## 2010-06-23 LAB — APTT: aPTT: 34 seconds (ref 24–37)

## 2010-06-23 MED ORDER — IOHEXOL 350 MG/ML SOLN
100.0000 mL | Freq: Once | INTRAVENOUS | Status: AC | PRN
  Administered 2010-06-23: 100 mL via INTRAVENOUS

## 2010-06-23 NOTE — Procedures (Signed)
NAME:  [name], [name]                  ACCOUNT NO.:  [id number]  MEDICAL RECORD NO.:  [id number]           PATIENT TYPE:  I  LOCATION:  2807                         FACILITY:  [facility]  PHYSICIAN:  Verne Carrow, MDDATE OF BIRTH:  1960/08/16  DATE OF PROCEDURE:  06/23/2010 DATE OF DISCHARGE:                           CARDIAC CATHETERIZATION   PRIMARY CARDIOLOGIST:  Luis Abed, MD, [facility]  PROCEDURES PERFORMED: 1. Left heart catheterization. 2. Selective coronary angiography. 3. Left ventricular angiogram.  OPERATOR:  Verne Carrow, MD.  ARTERIAL ACCESS SITE:  Right radial artery.  INDICATIONS:  This is a 50 year old Caucasian female with history of breast cancer and radiation, who had an episode of severe chest pain last night.  She was transferred to [facility] from [facility].  The patient had an abnormal EKG and her second set of cardiac enzymes showed a troponin of 0.7.  The patient was evaluated by Dr. Willa Rough this morning and based on her clinical presentation, diagnostic left heart catheterization was arranged.  PROCEDURE IN DETAIL:  The patient was brought to the main cardiac catheterization laboratory after signing informed consent for the procedure.  An Freida Busman test was performed on the right wrist and was positive.  The right wrist was prepped and draped in sterile fashion. Lidocaine 1% was used for local anesthesia.  A 5-French sheath was inserted into the right radial artery without difficulty.  Verapamil 3 mg was given through the sheath after insertion.  The patient was given 4000 units of intravenous heparin after sheath insertion.  Standard diagnostic catheters were used to perform selective coronary angiography.  A pigtail catheter was used to perform a left ventricular angiogram.  The patient tolerated the procedure well.  There were no immediate complications.  The sheath was removed from the right radial artery  and a Terumo hemostasis band was applied over the arteriotomy site.  The patient was taken to the holding area in stable condition.  HEMODYNAMIC FINDINGS:  Central aortic pressure 117/80.  Left ventricular pressure 115/15/0.  ANGIOGRAPHIC FINDINGS:  There was no angiographic evidence of coronary artery disease.  The left main artery bifurcated into the circumflex and the left anterior descending artery.  The left anterior descending was a large vessel that coursed to the apex and gave off a small-caliber diagonal branch from the mid vessel and a large-caliber bifurcating diagonal branch from the distal portion of the mid vessel.  The left anterior descending artery became relatively small caliber in its distal portion; however, it did wrap around the apex.  The circumflex artery gave off an early large obtuse marginal branch and a small-caliber second obtuse marginal branch.  There was no disease in this vessel. The right coronary artery is a large dominant vessel with no evidence of disease.  The left ventricular angiogram was performed in the RAO projection and showed normal left ventricular systolic function with ejection fraction of 65%.  IMPRESSION: 1. No angiographic evidence of coronary artery disease. 2. Normal left ventricular systolic function. 3. Questionable etiology of chest pain with mildly abnormal troponin     with no  evidence of pulmonary embolism.  In this situation, I would     consider coronary vasospasm as the cause of her chest pain and     abnormal cardiac enzymes.  RECOMMENDATIONS:  At this time, I would recommend medical management, which could include a calcium channel blocker for possible coronary vasospasm.  The patient will be watched closely tonight as she did have two significant contrast loads with her CAT scan earlier this morning followed by her cardiac catheterization today.  If her renal function is stable tomorrow and she is symptom free, I  would recommend discharge to home tomorrow.     Verne Carrow, MD     CM/MEDQ  D:  06/23/2010  T:  06/23/2010  Job:  [id number]  cc:   Luis Abed, MD, [facility]  Electronically Signed by Verne Carrow MD on 06/23/2010 01:12:24 PM

## 2010-06-23 NOTE — H&P (Signed)
NAME:  [name], [name]                  ACCOUNT NO.:  [id number]  MEDICAL RECORD NO.:  [id number]           PATIENT TYPE:  I  LOCATION:  2305                         FACILITY:  [facility]  PHYSICIAN:  Harlon Flor, MD   DATE OF BIRTH:  Feb 13, 1961  DATE OF ADMISSION:  06/23/2010 DATE OF DISCHARGE:                             HISTORY & PHYSICAL   PRIMARY CARE PHYSICIAN:  Gaspar Garbe, MD with [facility].  ADMISSION DIAGNOSIS:  Chest pain.  HISTORY OF PRESENT ILLNESS:  [name] is a pleasant 50 year old white female, who is transferred here from [facility] with chest pain and concern for EKG changes at that point.  She reports sharp substernal chest pain that began while she was throwing a ball with her children today.  The chest pain was severe radiating to her mid back and currently is only located in her back.  The pain was sharp.  She felt like she could not get comfortable.  It was not associated with shortness of breath or nausea or diaphoresis.  She was placed on heparin drip and a nitroglycerin drip, and due to concern for possible EKG changes, she was transferred to the ICU here at [facility].  On arrival, her chest pain is improved, but she continues to have mid subscapular pain that is sharp in nature.  PAST MEDICAL HISTORY: 1. Breast cancer in 2003, status post lumpectomy, chemo and radiation     as well as tamoxifen therapy. 2. Total abdominal hysterectomy.  MEDICATIONS:  None.  ALLERGIES:  PENICILLIN.  SOCIAL HISTORY:  She denies use of tobacco or alcohol.  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  FAMILY HISTORY:  Her mother had breast cancer.  She has multiple brothers that have acute MI in the 55s.  REVIEW OF SYSTEMS:  Full review of systems obtained and is negative except as noted in HPI.  PHYSICAL EXAMINATION:  VITAL SIGNS: Blood pressure is 145/85, pulse 85, respirations 16, temperature afebrile.  GENERAL:  No acute distress. HEENT:   Extraocular movements intact.  Oropharynx benign.  Nonicteric sclerae. NECK:  Supple. CARDIOVASCULAR:  Regular rate and rhythm without murmurs, rubs, or gallops. LUNGS:  Clear to auscultation bilaterally. ABDOMEN:  Soft, nontender, nondistended. EXTREMITIES:  No clubbing, cyanosis, or edema.  She has no tenderness to palpation along her back at any point. NEUROLOGIC:  Grossly afocal.  Moves all extremities well. LYMPHATIC:  No lymphadenopathy. SKIN:  No rashes.  EKG shows normal sinus rhythm with repolarization abnormalities.  White count of 7.1, hemoglobin 13, platelets 222.  Basic metabolic panel is unremarkable.  Troponin less than 0.05.  Chest x-ray is clear. Mediastinum is normal.  ASSESSMENT AND PLAN:  [name] is a pleasant 50 year old white female with risk factors for coronary artery disease that include previous radiation therapy as well as family history, who is transferred with somewhat atypical chest pain, sharp in nature, and now is located in her mid back.  She has had equal pulses bilaterally, and has been normotensive. 1. Chest pain:  She does have risk factors for coronary artery  disease.  We will continue her on aspirin and heparin drip and     start metoprolol 25 mg b.i.d.  We will follow her cardiac enzymes     and check her fasting lipids.  If she rules out, functional studies     likely order in the morning.  I suspect she will be able to     exercise and might even be a candidate for stress echo. 2. History of breast cancer:  This has been in remission.  She is     concerned about recurrence and if that could be causing some of her     pain and I agree.  Given the atypical nature of her pain and her history      of cancer, we will plan on getting a CTA scan of her chest     tonight to be sure there is no alternate explanation.     Harlon Flor, MD     MMB/MEDQ  D:  06/23/2010  T:  06/23/2010  Job:  [id number]  cc:   Gaspar Garbe,  M.D.  Electronically Signed by Meridee Score MD on 06/23/2010 02:23:13 PM

## 2010-06-24 LAB — CBC
MCH: 27.8 pg (ref 26.0–34.0)
MCHC: 33.3 g/dL (ref 30.0–36.0)
MCV: 83.6 fL (ref 78.0–100.0)
Platelets: 198 10*3/uL (ref 150–400)
RBC: 5 MIL/uL (ref 3.87–5.11)
RDW: 14.1 % (ref 11.5–15.5)

## 2010-06-24 LAB — BASIC METABOLIC PANEL
CO2: 23 mEq/L (ref 19–32)
GFR calc Af Amer: >60 mL/min (ref >60)
Glucose, Bld: 100 mg/dL — ABNORMAL HIGH (ref 70–99)

## 2010-07-08 ENCOUNTER — Encounter: Payer: Self-pay | Admitting: Cardiology

## 2010-07-17 ENCOUNTER — Encounter (HOSPITAL_COMMUNITY): Payer: 59

## 2010-07-19 ENCOUNTER — Encounter (HOSPITAL_COMMUNITY): Payer: 59

## 2010-07-20 NOTE — Discharge Summary (Signed)
NAME:  [name], [name]                  ACCOUNT NO.:  [id number]  MEDICAL RECORD NO.:  [id number]           PATIENT TYPE:  I  LOCATION:  6531                         FACILITY:  [facility]  PHYSICIAN:  Duke Salvia, MD, FACCDATE OF BIRTH:  Jun 03, 1960  DATE OF ADMISSION:  06/23/2010 DATE OF DISCHARGE:  06/24/2010                              DISCHARGE SUMMARY   PRIMARY CARDIOLOGIST:  Luis Abed, MD, [facility]  PRIMARY CARE PROVIDER:  Gaspar Garbe, MD  DISCHARGE DIAGNOSIS:  Non-ST-segment elevation myocardial infarction.  SECONDARY DIAGNOSES: 1. 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 2. Hypertension, newly diagnosed. 3. Hyperlipidemia, LDL of 123. 4. History of breast cancer, status post lumpectomy, chemotherapy,     radiation in 2003. 5. Status post total abdominal hysterectomy.  ALLERGIES:  PENICILLIN.  PROCEDURES: 1. Diagnostic cardiac catheterization revealing normal coronary    arteries and an EF of 65%. 2. CT angiography of the chest showing no evidence for pulmonary     embolus or aortic dissection.  Mild dependent atelectasis.  HISTORY OF PRESENT ILLNESS:  A 50 year old female with prior history of breast cancer in 2003, who was in her usual state of health until June 22, 2010 when she was outside playing with her children and had sudden onset of severe chest pain radiating to her mid back.  Pain was sharp in nature and was not associated with dyspnea, nausea, or diaphoresis.  Pain persisted for the latter part of the afternoon in the evening, and the patient eventually presented to the [facility] late in the evening with lab work drawn around midnight. Initially, point-of-care markers were negative.  The patient was transferred to [facility] for further evaluation.  HOSPITAL COURSE:  The patient was subsequently noted to rule in for non- ST-elevation MI, eventually peaking her CK at 711, MB at 94.8, troponin I of  5.19.  It should be noted that the patient underwent CT angiography of the chest that showed no evidence of pulmonary embolus or aortic dissection.  With elevated markers, decision was made to pursue catheterization, which took place on June 23, 2010 showing normal coronary arteries and EF of 65%.  In the absence of CAD, it was felt that the patient's presentation was most consistent with coronary vasospasm and we have therefore initiated calcium channel blocker therapy.  Further, we have discontinued aspirin and beta-blocker, which could potentially exacerbate vasospasm.  The patient has been ambulating without recurrent symptoms or limitations.  She is being seen by Cardiac Rehab and will be discharged home later today in good condition.  DISCHARGE LABS:  Hemoglobin 13.9, hematocrit 41.8, WBC 6.5, platelets 198.  Sodium 141, potassium 4.0, chloride 106, CO2 of 23, BUN 5, creatinine 0.71, glucose 100.  Total bilirubin 0.2, alkaline phosphatase 77, AST 26, ALT 19, total protein 6.5, albumin 3.3, calcium 9.3.  CK 711, MB 94.8, troponin I 5.19.  Total cholesterol 182, triglycerides 70, HDL 45, LDL 123.  TSH 3.816.  DISPOSITION:  The patient will be discharged home today in good condition.  FOLLOWUP PLANS AND APPOINTMENTS:  We will arrange for followup with Dr. Willa Rough in approximately 2 weeks.  She is asked to follow up with Dr. Wylene Simmer as previously scheduled.  DISCHARGE MEDICATIONS: 1. Aleve 220 mg q.12 h p.r.n. headaches. 2. Lipitor 10 mg at bedtime. 3. Norvasc 5 mg daily. 4. Calcium over the counter 1 tablet daily. 5. Claritin over the counter 1 tablet daily p.r.n. 6. Magnesium over the counter 1 tablet at bedtime. 7. Vitamin D over the counter 1 tablet daily.  OUTSTANDING LABS AND STUDIES:  The patient will require a followup lipids and LFTs in approximately 6 weeks given new statin therapy.  Duration of discharge encounter 60 minutes including physician  time.     Nicolasa Ducking, ANP   ______________________________ Duke Salvia, MD, [facility]    CB/MEDQ  D:  06/24/2010  T:  06/24/2010  Job:  [id number]  cc:   Gaspar Garbe, M.D.  Electronically Signed by Nicolasa Ducking ANP on 07/04/2010 04:09:33 PM Electronically Signed by Sherryl Manges MD [facility] on 07/20/2010 08:32:35 AM

## 2010-07-21 ENCOUNTER — Telehealth: Payer: Self-pay | Admitting: Cardiology

## 2010-07-21 ENCOUNTER — Encounter: Payer: Self-pay | Admitting: Cardiology

## 2010-07-21 ENCOUNTER — Ambulatory Visit (INDEPENDENT_AMBULATORY_CARE_PROVIDER_SITE_OTHER): Payer: 59 | Admitting: Cardiology

## 2010-07-21 DIAGNOSIS — E785 Hyperlipidemia, unspecified: Secondary | ICD-10-CM | POA: Insufficient documentation

## 2010-07-21 DIAGNOSIS — I214 Non-ST elevation (NSTEMI) myocardial infarction: Secondary | ICD-10-CM

## 2010-07-21 DIAGNOSIS — I10 Essential (primary) hypertension: Secondary | ICD-10-CM

## 2010-07-21 NOTE — Assessment & Plan Note (Signed)
The patient brought careful assessment of her blood pressures.  She has no blood pressure elevation while on medications.  At one point her systolic pressure was in the range of 110.  She decided to hold her amlodipine for close to one week.  She resumed her amlodipine.  No change in therapy.

## 2010-07-21 NOTE — Assessment & Plan Note (Addendum)
The patient's presentation was unusual.  She had significant pain and she had significant enzyme elevation.  Her CPK went as high as 711 and troponin 5.1.  It is presumed that she had spasm.  She has had slight discomfort at home since then.  I have discussed with her at great length the possibility of changing the doses of her medicines and possibly adding a nitrate.  However I feel it is most prudent today to continue the same dose of her medicines.  I cannot explain her ongoing chest discomfort.  It seems unusual that she has some pain on and off for a day at a time and then no pain for several days.  I've chosen not to pursue any further diagnostic studies at this point.  I will see her back for followup.  As part of today's evaluation I have carefully reviewed hospital records including her discharge summary and her catheterization report.  Have had extensive discussion with her about the concept of coronary spasm.

## 2010-07-21 NOTE — Telephone Encounter (Signed)
FMLA papers dropped off by pt from [facility] snet to [facility] for processing, pt also completed [employer] packet/Release of Information. 07/21/10/KM

## 2010-07-21 NOTE — Progress Notes (Signed)
HPI The patient is seen after hospitalization for a non-STEMI.  There had been no prior history of cardiac issues.  She presented to the hospital on June 23, 2010 with severe chest pain.  She had enzyme elevation.  End catheterization was done.  The catheter revealed no definite coronary disease.  It was felt that most likely she had had some coronary spasm.  She stabilized and was discharged home.  CT Scan of the chest revealed no pulmonary emboli.  Since being at home she has had some mild chest discomfort.  It occurs in the center of her chest and radiates to the left armpit.  This is similar to her original pain but with very minor intensity.  She says that it does not come on with exercise.  She also notes that when she gets it it comes and goes throughout a given day.  On the next day she might not have any. Allergies  Allergen Reactions  . Penicillins     REACTION: swelling    Current Outpatient Prescriptions  Medication Sig Dispense Refill  . amLODipine (NORVASC) 5 MG tablet Take 5 mg by mouth daily.        Marland Kitchen atorvastatin (LIPITOR) 10 MG tablet Take 10 mg by mouth daily.        . calcium carbonate (OS-CAL) 600 MG TABS Take 600 mg by mouth 2 (two) times daily with a meal.        . Cholecalciferol (VITAMIN D3) 2000 UNITS TABS Take 1.5 tablets by mouth daily.        Marland Kitchen loratadine (CLARITIN) 10 MG tablet Take 10 mg by mouth daily.        . magnesium gluconate (MAGONATE) 500 MG tablet Take 1,000 mg by mouth daily.        . naproxen sodium (ANAPROX) 220 MG tablet Take 220 mg by mouth as needed.        . vitamin E 400 UNIT capsule Take 400 Units by mouth daily.        Marland Kitchen DISCONTD: loratadine (ALAVERT) 10 MG tablet daily as needed.       Marland Kitchen DISCONTD: anastrozole (ARIMIDEX) 1 MG tablet Take 1 mg by mouth daily.        Marland Kitchen DISCONTD: Azelastine HCl (ASTEPRO) 0.15 % SOLN 1-2 sprays 2 (two) times daily as needed.        Marland Kitchen DISCONTD: mometasone (NASONEX) 50 MCG/ACT nasal spray 2 sprays by Nasal route  daily.        Marland Kitchen DISCONTD: Multiple Vitamin (MULTIVITAMIN) tablet Take 1 tablet by mouth daily.          History   Social History  . Marital Status: Married    Spouse Name: N/A    Number of Children: 1  . Years of Education: N/A   Occupational History  . SUPERVISOR [employer]   Social History Main Topics  . Smoking status: Never Smoker   . Smokeless tobacco: Not on file  . Alcohol Use: Not on file     2 per month  . Drug Use: Not on file  . Sexually Active: Not on file   Other Topics Concern  . Not on file   Social History Narrative  . No narrative on file    Family History  Problem Relation Age of Onset  . Cancer Mother     breast  . Diabetes Mother   . Arthritis Mother   . Clotting disorder Mother   . Heart attack  sibling  . Stroke      sibling    Past Medical History  Diagnosis Date  . Breast cancer     left breast ca, lumpectomy, chemotherapy, radiation.  2003  . Allergic rhinitis   . Non-STEMI (non-ST elevated myocardial infarction)     June 23, 2010, presumed vasospasm in the absence CAD at catheter  . Hypertension   . Dyslipidemia     Past Surgical History  Procedure Date  . Breast lumpectomy     Left  . Total abdominal hysterectomy 2008    ROS  Patient denies fever, chills, headache, sweats, rash, change in vision, change in hearing, cough, nausea vomiting, urinary symptoms.  All other systems are reviewed and are negative. PHYSICAL EXAM Patient is oriented to person time and place.  Affect is normal.  Head is atraumatic.  There is no xanthelasma.  No carotid bruits.  There is no jugular venous distention.  Lungs are clear.  Respiratory effort is not labored.  Cardiac exam reveals S1 and S2.  No clicks or significant murmurs.  The abdomen is soft.  There is no significant peripheral edema.  There are no musculoskeletal deformities. Filed Vitals:   07/21/10 1603  BP: 116/80  Pulse: 84  Resp: 12  Height: 5\' 7"  (1.702 m)    Weight: 219 lb (99.338 kg)    EKG  EKG is done today and reviewed by me.  There is mild decrease anterior R wave progression.  ASSESSMENT & PLAN

## 2010-07-21 NOTE — Assessment & Plan Note (Signed)
The patient's LDL was 123.  She is on a statin.  I explained to her the importance of her remaining on a statin for stabilization of the coronary endothelium.

## 2010-07-21 NOTE — Patient Instructions (Signed)
Your physician recommends that you schedule a follow-up appointment in: 8 weeks  

## 2010-07-24 ENCOUNTER — Encounter (HOSPITAL_COMMUNITY): Payer: 59 | Attending: Cardiology

## 2010-07-24 ENCOUNTER — Ambulatory Visit
Admission: RE | Admit: 2010-07-24 | Discharge: 2010-07-24 | Disposition: A | Discharge status: Home / Self Care | Payer: 59 | Source: Ambulatory Visit | Attending: Oncology | Admitting: Oncology

## 2010-07-24 ENCOUNTER — Ambulatory Visit (HOSPITAL_COMMUNITY): Payer: 59

## 2010-07-24 DIAGNOSIS — E785 Hyperlipidemia, unspecified: Secondary | ICD-10-CM | POA: Insufficient documentation

## 2010-07-24 DIAGNOSIS — Z5189 Encounter for other specified aftercare: Secondary | ICD-10-CM | POA: Insufficient documentation

## 2010-07-24 DIAGNOSIS — Z853 Personal history of malignant neoplasm of breast: Secondary | ICD-10-CM | POA: Insufficient documentation

## 2010-07-24 DIAGNOSIS — Z1239 Encounter for other screening for malignant neoplasm of breast: Secondary | ICD-10-CM

## 2010-07-24 DIAGNOSIS — I10 Essential (primary) hypertension: Secondary | ICD-10-CM | POA: Insufficient documentation

## 2010-07-24 DIAGNOSIS — I201 Angina pectoris with documented spasm: Secondary | ICD-10-CM | POA: Insufficient documentation

## 2010-07-24 DIAGNOSIS — I214 Non-ST elevation (NSTEMI) myocardial infarction: Secondary | ICD-10-CM | POA: Insufficient documentation

## 2010-07-25 ENCOUNTER — Other Ambulatory Visit: Payer: Self-pay | Admitting: Oncology

## 2010-07-25 DIAGNOSIS — R922 Inconclusive mammogram: Secondary | ICD-10-CM

## 2010-07-25 DIAGNOSIS — Z9889 Other specified postprocedural states: Secondary | ICD-10-CM

## 2010-07-26 ENCOUNTER — Encounter (HOSPITAL_COMMUNITY): Payer: 59

## 2010-07-28 ENCOUNTER — Encounter (HOSPITAL_COMMUNITY): Payer: 59

## 2010-07-31 ENCOUNTER — Encounter (HOSPITAL_COMMUNITY): Payer: 59 | Attending: Cardiology

## 2010-07-31 DIAGNOSIS — Z5189 Encounter for other specified aftercare: Secondary | ICD-10-CM | POA: Insufficient documentation

## 2010-07-31 DIAGNOSIS — Z853 Personal history of malignant neoplasm of breast: Secondary | ICD-10-CM | POA: Insufficient documentation

## 2010-07-31 DIAGNOSIS — I214 Non-ST elevation (NSTEMI) myocardial infarction: Secondary | ICD-10-CM | POA: Insufficient documentation

## 2010-07-31 DIAGNOSIS — I201 Angina pectoris with documented spasm: Secondary | ICD-10-CM | POA: Insufficient documentation

## 2010-07-31 DIAGNOSIS — E785 Hyperlipidemia, unspecified: Secondary | ICD-10-CM | POA: Insufficient documentation

## 2010-07-31 DIAGNOSIS — I10 Essential (primary) hypertension: Secondary | ICD-10-CM | POA: Insufficient documentation

## 2010-08-02 ENCOUNTER — Encounter (HOSPITAL_COMMUNITY): Payer: 59

## 2010-08-04 ENCOUNTER — Encounter (HOSPITAL_COMMUNITY): Payer: 59

## 2010-08-07 ENCOUNTER — Encounter (HOSPITAL_COMMUNITY): Payer: 59

## 2010-08-09 ENCOUNTER — Encounter (HOSPITAL_COMMUNITY): Payer: 59

## 2010-08-11 ENCOUNTER — Encounter (HOSPITAL_COMMUNITY): Payer: 59

## 2010-08-14 ENCOUNTER — Encounter (HOSPITAL_COMMUNITY): Payer: 59

## 2010-08-16 ENCOUNTER — Encounter: Payer: Self-pay | Admitting: Internal Medicine

## 2010-08-16 ENCOUNTER — Ambulatory Visit (INDEPENDENT_AMBULATORY_CARE_PROVIDER_SITE_OTHER): Payer: 59 | Admitting: Internal Medicine

## 2010-08-16 ENCOUNTER — Encounter (HOSPITAL_COMMUNITY): Payer: 59

## 2010-08-16 VITALS — BP 124/62 | HR 83 | Ht 67.0 in | Wt 222.2 lb

## 2010-08-16 DIAGNOSIS — J309 Allergic rhinitis, unspecified: Secondary | ICD-10-CM

## 2010-08-16 MED ORDER — IPRATROPIUM BROMIDE 0.06 % NA SOLN: NASAL | Status: DC

## 2010-08-16 NOTE — Patient Instructions (Signed)
Consider raising the head of your bed a little and using nasal strips for stuffiness at night  Try script ipratropium nasal spray before meals for the watery nose problem. Script sent. You can continue using Nasonex and antihistamines as needed.   Try fexofenadine 180 (24 hour)/ Allegra 180 as an antihistamine alternative to claritin

## 2010-08-16 NOTE — Assessment & Plan Note (Addendum)
Seasonal allergic rhinitis is managed with antihistamines and nasonex. We discussed comparison antihistamines and decongestants. She can try plain allegra. I suspect rhinorhea with meals is vasomotor and will let her try an anticholinergic nasal spray.

## 2010-08-16 NOTE — Progress Notes (Signed)
  Subjective:    Patient ID: [name], female    DOB: [birth year], 50 y.o.   MRN: [id number]  HPI 50 yoF followed for allergic rhinitis and bronchitis with complicating hx of breast cancer, MI, HTN. She complains of a chronic pattern of watery rhinorhea with meals, sounding separate from her seasonal rhinitis. Uses Nasonex for seasonal stuffy rhinitis, but trial Astepro made her too sleepy. Allegra-d works best, but with hx of MI she uses it very sparingly. Has encasings on bedding.  Review of Systems See HPI Constitutional:   No weight loss, night sweats,  Fevers, chills, fatigue, lassitude. HEENT:   No headaches,  Difficulty swallowing,  Tooth/dental problems,  Sore throat,                No sneezing, itching, ear ache. Admits- nasal congestion- especially when supine   CV:  No chest pain,  Orthopnea, PND, swelling in lower extremities, anasarca, dizziness, palpitations  GI  No heartburn, indigestion, abdominal pain, nausea, vomiting, diarrhea, change in bowel habits, loss of appetite  Resp: No shortness of breath with exertion or at rest.  No excess mucus, no productive cough,  No non-productive cough,  No coughing up of blood.  No change in color of mucus.  No wheezing.  No chest wall deformity  Skin: no rash or lesions.  GU: no dysuria, change in color of urine, no urgency or frequency.  No flank pain.  MS:  No joint pain or swelling.  No decreased range of motion.  No back pain.  Psych:  No change in mood or affect. No depression or anxiety.  No memory loss.      Objective:   Physical Exam General- Alert, Oriented, Affect-appropriate, Distress- none acute  overweight  Skin- rash-none, lesions- none, excoriation- none  Lymphadenopathy- none  Head- atraumatic  Eyes- Gross vision intact, PERRLA, conjunctivae clear secretions  Ears- Hearing normal canals, Tm L ,   R ,  Nose- Clear, No-  Septal dev, mucus, polyps, erosion, perforation. No obvious increased  discharge.  Throat- Mallampati II-III , mucosa clear , drainage- none, tonsils- atrophic  Neck- flexible , trachea midline, no stridor , thyroid nl, carotid no bruit  Chest - symmetrical excursion , unlabored     Heart/CV- RRR , no murmur , no gallop  , no rub, nl s1 s2                     - JVD- none , edema- none, stasis changes- none, varices- none     Lung- clear to P&A, wheeze- none, cough- none , dullness-none, rub- none. She can demonstrate voluntarily a vibratory cough.      Chest wall- Abd- tender-no, distended-no, bowel sounds-present, HSM- no  Br/ Gen/ Rectal- Not done, not indicated  Extrem- cyanosis- none, clubbing, none, atrophy- none, strength- nl  Neuro- grossly intact to observation         Assessment & Plan:

## 2010-08-18 ENCOUNTER — Encounter (HOSPITAL_COMMUNITY): Payer: 59

## 2010-08-18 ENCOUNTER — Ambulatory Visit
Admission: RE | Admit: 2010-08-18 | Discharge: 2010-08-18 | Disposition: A | Discharge status: Home / Self Care | Payer: 59 | Source: Ambulatory Visit | Attending: Oncology | Admitting: Oncology

## 2010-08-18 DIAGNOSIS — R922 Inconclusive mammogram: Secondary | ICD-10-CM

## 2010-08-18 DIAGNOSIS — Z9889 Other specified postprocedural states: Secondary | ICD-10-CM

## 2010-08-18 MED ORDER — GADOBENATE DIMEGLUMINE 529 MG/ML IV SOLN
18.0000 mL | Freq: Once | INTRAVENOUS | Status: AC | PRN
  Administered 2010-08-18: 18 mL via INTRAVENOUS

## 2010-08-20 ENCOUNTER — Encounter: Payer: Self-pay | Admitting: Internal Medicine

## 2010-08-21 ENCOUNTER — Encounter (HOSPITAL_COMMUNITY): Payer: 59

## 2010-08-23 ENCOUNTER — Encounter (HOSPITAL_COMMUNITY): Payer: 59

## 2010-08-25 ENCOUNTER — Other Ambulatory Visit: Payer: Self-pay | Admitting: Gynecology

## 2010-08-25 ENCOUNTER — Encounter (HOSPITAL_COMMUNITY): Payer: 59

## 2010-08-28 ENCOUNTER — Encounter (HOSPITAL_COMMUNITY): Payer: 59

## 2010-08-30 ENCOUNTER — Encounter (HOSPITAL_COMMUNITY): Payer: 59

## 2010-08-30 ENCOUNTER — Encounter (HOSPITAL_COMMUNITY): Payer: 59 | Attending: Cardiology

## 2010-08-30 DIAGNOSIS — I10 Essential (primary) hypertension: Secondary | ICD-10-CM | POA: Insufficient documentation

## 2010-08-30 DIAGNOSIS — I214 Non-ST elevation (NSTEMI) myocardial infarction: Secondary | ICD-10-CM | POA: Insufficient documentation

## 2010-08-30 DIAGNOSIS — I201 Angina pectoris with documented spasm: Secondary | ICD-10-CM | POA: Insufficient documentation

## 2010-08-30 DIAGNOSIS — Z853 Personal history of malignant neoplasm of breast: Secondary | ICD-10-CM | POA: Insufficient documentation

## 2010-08-30 DIAGNOSIS — E785 Hyperlipidemia, unspecified: Secondary | ICD-10-CM | POA: Insufficient documentation

## 2010-08-30 DIAGNOSIS — Z5189 Encounter for other specified aftercare: Secondary | ICD-10-CM | POA: Insufficient documentation

## 2010-09-01 ENCOUNTER — Encounter (HOSPITAL_COMMUNITY): Payer: 59

## 2010-09-04 ENCOUNTER — Encounter (HOSPITAL_COMMUNITY): Payer: 59

## 2010-09-06 ENCOUNTER — Encounter (HOSPITAL_COMMUNITY): Payer: 59

## 2010-09-08 ENCOUNTER — Encounter (HOSPITAL_COMMUNITY): Payer: 59

## 2010-09-11 ENCOUNTER — Encounter (HOSPITAL_COMMUNITY): Payer: 59

## 2010-09-11 ENCOUNTER — Encounter: Payer: Self-pay | Admitting: Cardiology

## 2010-09-11 ENCOUNTER — Ambulatory Visit: Payer: 59 | Admitting: Cardiology

## 2010-09-11 ENCOUNTER — Ambulatory Visit (INDEPENDENT_AMBULATORY_CARE_PROVIDER_SITE_OTHER): Payer: 59 | Admitting: Cardiology

## 2010-09-11 DIAGNOSIS — I214 Non-ST elevation (NSTEMI) myocardial infarction: Secondary | ICD-10-CM

## 2010-09-11 DIAGNOSIS — I10 Essential (primary) hypertension: Secondary | ICD-10-CM

## 2010-09-11 MED ORDER — ASPIRIN EC 81 MG PO TBEC: 81.0000 mg | DELAYED_RELEASE_TABLET | Freq: Every day | ORAL | Status: AC

## 2010-09-11 NOTE — Patient Instructions (Signed)
Your physician recommends that you schedule a follow-up appointment in: 6 months with Dr. Myrtis Ser Your physician has recommended you make the following change in your medication: Start aspirin 81 mg by mouth daily.

## 2010-09-11 NOTE — Assessment & Plan Note (Signed)
The patient is quite stable.  No change in therapy and no further workup.  We will continue amlodipine. I have discussed use of aspirin with her.  Since she has had this event I feel it is appropriate for her to be on 81 mg of aspirin daily.  She does not have a contraindication that I am aware of.

## 2010-09-11 NOTE — Progress Notes (Signed)
HPI The patient is seen for cardiology followup.  I saw her last in the office July 21, 2010.  At that time she was posthospitalization.  She had a non-STEMI and catheterization revealed normal coronaries.  There was question that this could be coronary spasm.  When I saw her in the office she had some slight chest discomfort.  I felt she was stable and she's gone about full activity since then and she is feeling well. Allergies  Allergen Reactions  . Penicillins     REACTION: swelling    Current Outpatient Prescriptions  Medication Sig Dispense Refill  . amLODipine (NORVASC) 5 MG tablet Take 5 mg by mouth daily.        Marland Kitchen atorvastatin (LIPITOR) 10 MG tablet Take 10 mg by mouth daily.        . calcium carbonate (OS-CAL) 600 MG TABS Take 600 mg by mouth 2 (two) times daily with a meal.        . Cholecalciferol (VITAMIN D3) 2000 UNITS TABS 1 1/2 tab po qd      . ESTRING 2 MG vaginal ring 1 Units by Intrauterine route Every 3 months.      Marland Kitchen ipratropium (ATROVENT) 0.06 % nasal spray 1-2 sprays each nostril up to 4 times daily, before meals, as needed  15 mL  1  . loratadine (CLARITIN) 10 MG tablet Take 10 mg by mouth daily. As needed      . magnesium gluconate (MAGONATE) 500 MG tablet Take 1,000 mg by mouth daily.        . naproxen sodium (ANAPROX) 220 MG tablet Take 220 mg by mouth as needed.        . vitamin E 400 UNIT capsule Take 400 Units by mouth daily.          History   Social History  . Marital Status: Married    Spouse Name: N/A    Number of Children: 1  . Years of Education: N/A   Occupational History  . SUPERVISOR [employer]   Social History Main Topics  . Smoking status: Never Smoker   . Smokeless tobacco: Not on file  . Alcohol Use: Not on file     2 per month  . Drug Use: Not on file  . Sexually Active: Not on file   Other Topics Concern  . Not on file   Social History Narrative  . No narrative on file    Family History  Problem Relation Age of  Onset  . Cancer Mother     breast  . Diabetes Mother   . Arthritis Mother   . Clotting disorder Mother   . Heart attack      sibling  . Stroke      sibling    Past Medical History  Diagnosis Date  . Breast cancer     left breast ca, lumpectomy, chemotherapy, radiation.  2003  . Allergic rhinitis   . Non-STEMI (non-ST elevated myocardial infarction)     June 23, 2010, presumed vasospasm in the absence CAD at catheter  . Hypertension   . Dyslipidemia     Past Surgical History  Procedure Date  . Breast lumpectomy     Left  . Total abdominal hysterectomy 2008    ROS  Patient denies fever, chills, headache, sweats, rash, change in vision, change in hearing, chest pain, cough, nausea vomiting, urinary symptoms all other systems are reviewed and are negative.  PHYSICAL EXAM Patient is stable.  She  is oriented to person time and place.  Affect is normal.  There is no xanthelasma.  There is no jugular venous distention.  Lungs are clear.  Respiratory effort is nonlabored.  Cardiac exam reveals S1 and S2.  There are no clicks or significant murmurs.  The abdomen is soft.  There is no peripheral edema. Filed Vitals:   09/11/10 0915  BP: 112/79  Pulse: 77  Resp: 14  Height: 5\' 7"  (1.702 m)  Weight: 219 lb (99.338 kg)    EKG  No labs were done today.  ASSESSMENT & PLAN

## 2010-09-11 NOTE — Assessment & Plan Note (Signed)
Blood pressure is under good control. No change in therapy 

## 2010-09-12 NOTE — Assessment & Plan Note (Signed)
[facility]                             PULMONARY OFFICE NOTE   RITIKA, HELLICKSON                         MRN:          [id number]  DATE:12/17/2006                            DOB:          08-11-1960    PROBLEM LIST:  1. Allergic rhinitis.  2. History of breast cancer (Dr. Darrold Span).   HISTORY:  One year followup.  She caught colds twice in February and  again this past June.  She realizes it takes her longer to get over than  other people seem to need.  In the past year she had had a total  hysterectomy and started Femara.  We had intended to give ipratropium  nasal spray last visit.  She never got it and does not think now that  she needs it.  Her husband continues to give her allergy injections with  no problems, and we reviewed risks, safety, and indications.  She tried  stopping in 2004, got worse rapidly, and again got better quickly when  she resumed her shots.  She is reluctant to try stopping, although we  discussed that as an option.  She wants to return in a year for  retesting which will be arranged.   MEDICATIONS:  1. Caltrate 600 mg b.i.d.  2. Multivitamins.  3. Tamoxifen 20 mg.  4. Vitamin E.  5. Allergy vaccine.  6. Femara.  7. Vitamin D.  8. She has an Epipen.   Drug intolerant to PENICILLIN.   OBJECTIVE:  Weight 195 pounds, BP 112/60, pulse 77, room air saturation  99%.  EYES, NOSE, THROAT:  Clear.  CHEST:  Clear.  Heart sounds normal.   IMPRESSION:  1. Allergic rhinitis, currently controlled.  2. Episodic viral syndrome upper respiratory infections.   PLAN:  1. Continue present therapy.  2. Schedule return 1 year for allergy retesting.     Clinton D. Maple Hudson, MD, Tonny Bollman, FACP  Electronically Signed    CDY/MedQ  DD: 12/17/2006  DT: 12/18/2006  Job #: [id number]   cc:   Quita Skye. Artis Flock, M.D.

## 2010-09-13 ENCOUNTER — Encounter (HOSPITAL_COMMUNITY): Payer: 59

## 2010-09-15 ENCOUNTER — Encounter (HOSPITAL_COMMUNITY): Payer: 59

## 2010-09-15 NOTE — Op Note (Signed)
[place]  Patient:    [name], [name] Visit Number: [id number] MRN: [id number]          Service Type: DSU Location: [facility] Attending Physician:  Charlton Haws Dictated by:   Currie Paris, M.D. Proc. Date: 08/14/01 Admit Date:  08/14/2001   CC:         Gretta Cool, M.D.  Quita Skye Artis Flock, M.D.  Lennis P. Darrold Span, M.D.   Operative Report  CCS# [id number]  PREOPERATIVE DIAGNOSIS:  Carcinoma left breast.  POSTOPERATIVE DIAGNOSIS:  Carcinoma left breast.  OPERATION PERFORMED:  Placement of Port-A-Cath for IV access for chemotherapy.  SURGEON:  Currie Paris, M.D.  ANESTHESIA:  MAC.  INDICATIONS FOR PROCEDURE:  The patient is a 50 year old who elected to have a PAC placed for IV access for her chemotherapy.  DESCRIPTION OF PROCEDURE:  The patient was seen in the holding area and had no further questions.  She was taken to the operating room and given intravenous sedation.  She was placed in Trendelenburg position.  The upper chest and lower neck area was prepped and draped as a sterile field.  Local was infiltrated in the right infraclavicular fossa and the right subclavian vein entered on the first attempt and the guide wire threaded easily and fluoroscopy confirmed position in the superior vena cava. Additional local was infiltrated in the anterior chest wall and a transverse incision made and a pocket for the reservoir fashioned down at the level of the fascia.  Additional local was infiltrated and the Port-A-Cath tubing brought from the guide wire site into the reservoir site and flushed.  The guide wire tract was dilated once with a #10 dilator and then the dilator and peel-away sheath were introduced and went easily.  The dilator and guide wire were removed and catheter was threaded but hung up right at the junction of the first rib and clavicle.  Multiple attempts to get it to pass were unsuccessful.  I finally  shortened the catheter and pulled it back so that it was just not in the tunnel any longer and threaded the guide wire through it. Using fluoroscopy, I was able to finally manipulate the catheter guide wire into the superior vena cava although it tended to want to go across to the opposite side.  Once that was done, it was flushed and aspirated and irrigated easily and I also flushed it once with some contrast to be sure we had good positioning.  It was actually then pulled back through the tunnel and position backed up a little bit so it was near the junction of the superior vena cava and the atrium.  The reservoir was flushed and then attached.  The locking mechanism was engaged.  It aspirated, flushed easily.  It was sutured to the fascia with 2-0 Prolene.  Final check with fluoroscopy showed we had good positioning.  The incision was closed with 3-0 Vicryl and 4-0 Monocryl subcuticular plus Steri-Strips.  Prior to closing I flushed it a final time with concentrated aqueous heparin.  The patient tolerated the procedure well.  There were no operative complications. All counts were correct. Dictated by:   Currie Paris, M.D. Attending Physician:  Charlton Haws DD:  08/14/01 TD:  08/14/01 Job: [id number]

## 2010-09-15 NOTE — H&P (Signed)
NAME:  [name], [name]                  ACCOUNT NO.:  [id number]   MEDICAL RECORD NO.:  [id number]          PATIENT TYPE:  AMB   LOCATION:  DAY                          FACILITY:  [facility]   PHYSICIAN:  Gretta Cool, M.D. DATE OF BIRTH:  25-Aug-1960   DATE OF ADMISSION:  DATE OF DISCHARGE:                              HISTORY & PHYSICAL   CHIEF COMPLAINT:  Abnormal uterine bleeding.   HISTORY OF PRESENT ILLNESS:  A 50 year old white, married, nulligravida  with a personal history of breast cancer in 2002 post lumpectomy,  sentinel node lymph dissection, chemotherapy with Adriamycin and Cytoxan  and then tamoxifen therapy.  She has HER2-positive breast disease,  weekly ER positive strongly progesterone positive with one positive  node.  She is at high risk for recurrence because of the pathology and  HER2 positive.  She has had development of an endometrial polyp on  tamoxifen therapy.  She had that resected with focal complex endometrial  hyperplasia.  She now has recurrence of a second endometrial polyp.  She  has high estrogen levels on tamoxifen therapy.  Now, with recurrence of  abnormal bleeding and endometrial polyp, she is admitted for  hysterectomy,  Salpingo-oophorectomy, and will be transferred to Arimidex therapy  rather than tamoxifen post hysterectomy.  She understands the risks,  benefits, options and alternatives of all.   PAST MEDICAL HISTORY:  1. Usual childhood diseases without sequelae.  2. Hidradenitis with recurrent episodes, well controlled on      doxycycline.  3. Previous hospitalizations for breast biopsy in 1996 and then      definitive breast treatment in 2002.   FAMILY HISTORY:  Father died of an auto accident at age 82.  Mom had  breast cancer post therapy without evidence of recurrence, but has  lymphoma now.  One brother died of a heart attack at age 76.   REVIEW OF SYSTEMS:  HEENT:  Denies symptoms.  CARDIORESPIRATORY:  Denies  asthma, cough,  bronchitis, shortness of breath.  GI/GU:  Denies  frequency, urgency, change in bowel habits or food intolerance.   PHYSICAL EXAMINATION:  GENERAL:  Generally well-developed, well-  nourished white female.  HEENT:  Pupils equal and reactive to light and accommodate.  Fundi  benign.  Oropharynx clear.  NECK:  Supple without mass or thyroid enlargement.  BREASTS:  Left lumpectomy with minimal fibrosis from radiation.  Her  right breast is without mass, nodes or nipple discharge.  Node-bearing  area is negative.  HEART:  Regular rhythm without murmur or cardiac enlargement.  CHEST:  Clear P to A.  ABDOMEN:  Soft, scaphoid without mass or organomegaly.  PELVIC:  External genitalia normal.  Female vagina clean.  Good estrogen  effect.  Cervix is nulliparous and clean.  Uterus normal size, shape and  contour.  Adnexa clear.  EXTREMITIES:  Negative.  NEUROLOGIC:  Physiologic.   IMPRESSION:  1. Endometrial polyp, recurrent, with abnormal uterine bleeding.  2. History of invasive ductal comedocarcinoma, node positive and  HER2-positive postlumpectomy radiation and chemotherapy with persistent  estrogen effect and recurrent polyp formation on  tamoxifen and high  endogenous estrogens.  She is now admitted for definitive therapy by  hysterectomy and salpingo-oophorectomy.  We will then convert to  Arimidex therapy rather than tamoxifen.           ______________________________  Gretta Cool, M.D.     CWL/MEDQ  D:  08/11/2006  T:  08/11/2006  Job:  [id number]   cc:   Gretta Cool, M.D.  Fax: [phone]   Lennis P. Darrold Span, M.D.  Fax: [phone]   Currie Paris, M.D.  [address], [address]  [place]  [place] [address]   Hulan Saas, MD

## 2010-09-15 NOTE — H&P (Signed)
[facility]  Patient:    [name], [name] Visit Number: [id number] MRN: [id number]          Service Type: MED Location: 2S [phone] Attending Physician:  Jama Flavors Pearcy Dictated by:   Juanita Craver. Darrold Span, M.D. Admit Date:  10/07/2001   CC:         Currie Paris, M.D.   History and Physical  DATE OF BIRTH:  [birth year].  HISTORY OF PRESENT ILLNESS:  The patient is seen together with her husband in the office and admitted for planned third cycle of adjuvant Adriamycin, Cytoxan chemotherapy for history of left breast cancer, this admission necessary due to refractory nausea and vomiting with her first cycle of outpatient chemotherapy.  The patient did significantly better with the second cycle of treatment given in the hospital with continuous IV fluids and IV antiemetics.  She did receive Neulasta after the second cycle of chemotherapy which also helpful with blood counts.  The patient was initially diagnosed with T1N (IT) ER/PR positive, HER2 3+ left breast carcinoma in February 2003 treated with lumpectomy and a sentinel node evaluation by Dr. Jamey Ripa.  We plan local radiation by Dr. Kathrynn Running and tamoxifen following completion of the chemotherapy.  Her second cycle of Adriamycin, Cytoxan was given in hospital at [facility] Sep 16, 2001, and the patient was discharged from the hospital on Sep 18, 2001, with her nausea at that time adequately controlled.  She has not have fevers or symptoms of infection with this cycle.  Energy level has been back to essentially her baseline this past week.  Bowels have been moving regularly.  No symptoms of bladder infection and good appetite.  She did have recent episode of recurrent genital herpes which responded to oral Valtrex by Dr. Nicholas Lose and has completely resolved.  She has Port-A-Cath in place which has not given any problems.  REVIEW OF SYSTEMS:  As above.  She did have an 8 day menstrual cycle  since her last treatment.  ALLERGIES:  PENICILLIN.  PAST MEDICAL HISTORY:  She is gravida 0, para 0.  PAST MEDICAL HISTORY:  Environmental allergies followed by Dr. Webb Laws. History of recurrent urinary tract infections.  FAMILY HISTORY:  Positive for metastatic breast cancer in her mother.  SOCIAL HISTORY:  She is married.  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  She has adopted her husbands son.  She has been married for 13 years.  Husbands first wife died of metastatic breast cancer.  No tobacco or alcohol.  PHYSICAL EXAMINATION: VITAL SIGNS:  Weight 176, height 5 feet 8-1/4 inches.  Blood pressure 109/74. Afebrile.  Heart rate is 85 and regular.  Respirations nonlabored.  HEENT:  Alopecia. Pupils are equal and reactive.  Oral mucosa and posterior pharynx clear.  NECK:  Supple.  LYMPHATIC:  No peripheral adenopathy.  No cervical, supraclavicular, bilateral axillary.  LUNGS:  Clear to the bases.  The port is nontender and without erythema and the end is in place.  BREASTS:  The left breast has well-healed lumpectomy scar without evidence of local recurrence.  Nothing in the left axilla.  Right breast without masses, skin changes, nipple discharge and the axilla is benign.  HEART:  Regular rate and rhythm without murmurs or gallops.  ABDOMEN:  Soft and nontender without appreciable organomegaly or masses.  EXTREMITIES:  Lower extremities without edema.  LABORATORY DATA:  Today white count 4900, ANC of 3.1, hemoglobin 12.1, platelets 400,000.  Chemistries pending.  IMPRESSION AND PLAN: 1. T1 N0 (IT) left breast carcinoma, HER2 3+ in premenopausal patient    admitted for third of four planned cycles of adjuvant Adriamycin and    Cytoxan chemotherapy with the admission necessary due to refractory    nausea and vomiting outpatient previously. 2. Iron deficiency anemia, improved on oral iron. 3. Port-A-Cath in place. 4. History of  recurrent urinary tract infections. Dictated by:   Lennis P. Darrold Span, M.D. Attending Physician:  Mertie Clause DD:  10/07/01 TD:  10/09/01 Job: [id number]

## 2010-09-15 NOTE — Discharge Summary (Signed)
[facility]  Patient:    [name], [name] Visit Number: [id number] MRN: [id number]          Service Type: MED Location: 2S 0254 01 Attending Physician:  Jama Flavors Pearcy Dictated by:   Juanita Craver. Darrold Span, M.D. Admit Date:  09/16/2001 Discharge Date: 09/18/2001   CC:         Currie Paris, M.D.  Gretta Cool, M.D.  Margaretmary Dys, M.D.   Discharge Summary  DATE OF BIRTH:  Jul 11, 1960  HISTORY OF PRESENT ILLNESS:  The patient is a 50 year old, white female with T1a sentinel node positive by immunohistochemistry HER-2/ne,. 3+ ER/PR positive left breast carcinoma which was diagnosed in February 2003.  She was admitted to [facility] for her second cycle of adjuvant Adriamycin and Cytoxan chemotherapy.  Admission necessary due to refractory and nausea and vomiting with cycle #1 which was given as outpatient.  For details of her presentation and course to this point, please see the dictated H&P.  Review of systems, past medical history, family history and social history is in the dictated H&P.  PHYSICAL EXAMINATION:  VITAL SIGNS:  Height 5 feet 8 inches, weight 175 pounds.  Blood pressure 113/72, afebrile, heart rate 85 and regular respiratory rate and not labored.  HEENT:  Alopecia.  Pupils are equal, round and reactive to light.  She is nonicteric.  Oral mucosa and posterior pharynx are clear.  Mucous membranes are moist.  NECK:  Supple.  She has no cervical, supraclavicular or bilateral axillary adenopathy.  LUNGS:  Clear to the bases.  HEART:  Regular rate and rhythm.  Port-A-Cath in the right anterior chest is accessed.  Site is unremarkable.  BREASTS:  Right breast without dominant masses.  Nothing in the right axilla. Left breast with well-healed lumpectomy scar.  No dominant masses.  Axilla benign.  No swelling in left upper extremity.  ABDOMEN:  Soft and nontender.  Bowel sounds present with no  appreciable organomegaly or masses.  EXTREMITIES:  Lower extremities without edema.  NEUROLOGIC:  Nonfocal.  LABORATORY DATA AND X-RAY FINDINGS:  Prior to admission in the office, white count was 3700, ANC 2.2, hemoglobin 11.8, platelets 299.  Chemistries with CMET sent from the office on the day of admission were unremarkable with normal bilirubin and creatinine.  HOSPITAL COURSE:  The patient was admitted to the oncology unit and begun on IV hydration which was continued throughout the hospital stay.  She was treated with chemotherapy on Sep 16, 2001.  Adriamycin 60 mg/sq m equals (114 mg) and Cytoxan 600 mg/sq m (1140 mg) both IV.  She was premedicated with Decadron 20 IV, Benadryl 25 IV, Zofran 16 mg IV, Ativan 1 mg IV and Protonix 40 mg IV.  She was given IV Zofran at regular intervals through the remainder of the hospital stay.  IV Ativan q.4h. x 2 additional scheduled doses after the premedicines and then q.4h. p.r.n.  Protonix was also continued at 40 mg IV b.i.d.  IV fluids were maintained at 75 cc/hour initially with 10 mEq of Kay Ciel and then D-5 normal saline with [employer].  BMET was normal on Sep 17, 2001.  However, by Sep 18, 2001, potassium had dropped to 3.1 and this was supplemented.  She had occasional episodes of nausea with vomiting during the hospitalization.  All was fairly easily controlled with medications as above. She did not feel that she was able to take p.o. for over 24 hours after the chemotherapy.  However, by the morning of April 22, she was able to drink water and was keeping this down.  Urinalysis and urine cultures sent on the day of admission because of history of recurrent UTIs with no evidence of infection by the time of discharge and will be followed up as an outpatient. We had no problems with the Port-A-Cath during the hospital stay.  Bowels had not moved during the hospitalization and she will be given glycerin suppository prior to  discharge.  IMPRESSION/PLAN:  T1, left breast carcinoma in premenopausal patient, now post second cycle of adjuvant Adriamycin and Cytoxan given in hospital with much better control of her nausea and vomiting with prevention of dehydration that she had had with cycle #1 outpatient.  She will be in touch with Dr. Darrold Span if she is not able to drink four to five eight ounce glasses of fluids every 24 hours or if nausea medication at home is not sufficient.  She will continue on her Nexium daily at least for the next week.  She will receive Neulasta injection at our office on May 24, and see Dr. Darrold Span with lab work on May 26.  DISCHARGE MEDICATIONS: 1. Ativan 1 mg q.4-6h. p.r.n. nausea. 2. Zofran 8 mg one to two daily as needed for nausea. 3. Nexium 40 mg one daily for the next week with additional one tablet for    b.i.d. dosing p.r.n., then one to two tablets daily p.r.n. 4. No iron for at least a week. 5. Claritin and allergy shots as previously. 6. Correctol or laxative of choice. 7. Stool softener of choice. 8. Glycerin suppository as needed.  ACTIVITY:  As tolerated.  DIET:  As tolerated and as instructed by dietitian who followed up in hospital.  SPECIAL INSTRUCTIONS:  She is to call with any questions or concerns.  FOLLOWUP:  Followup visits as above.  DISCHARGE DIAGNOSES: 1. T1, left breast carcinoma, status post second cycle adjuvant Adriamycin and    Cytoxan chemotherapy. 2. Refractory nausea and vomiting with this chemotherapy given outpatient. 3. Port-A-Cath in place. 4. Iron deficiency anemia responding to oral iron with hemoglobin adequate at    the time of chemotherapy and we will hold the iron for at least a week    because of the nausea potential. 5. Past history of recurrent bladder infections, urine specimens without     evidence of infection at this point. 6. Environmental allergies.  CONDITION ON DISCHARGE:  Stable. Dictated by:   Lennis P. Darrold Span,  M.D. Attending Physician:  Mertie Clause DD:  09/18/01 TD:  09/20/01 Job: [id number]

## 2010-09-15 NOTE — Op Note (Signed)
[facility]  Patient:    [name], [name] Visit Number: [id number] MRN: [id number]          Service Type: DSU Location: [facility] Attending Physician:  Charlton Haws Dictated by:   Currie Paris, M.D. Proc. Date: 07/10/01 Admit Date:  07/10/2001 Discharge Date: 07/10/2001   CC:         Moshe Salisbury, N.P.  [facility]   Operative Report  Visit Number [id number]  Office Medical Record Number [id number]  PREOPERATIVE DIAGNOSIS:  Carcinoma of the left breast, upper outer quadrant.  POSTOPERATIVE DIAGNOSIS:  Carcinoma of the left breast, upper outer quadrant.  OPERATION/PROCEDURE:  Wide local excision needle lobe technique of left breast cancer with blue dye injection and sentinel node dissection.  SURGEON:  Currie Paris, M.D.  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  After discussing alternatives with the patient, we elected to proceed to needle guided wide local excision and sentinel node dissection.  DESCRIPTION OF PROCEDURE:  The patient was seen in the holding area and she had already had her dye injected.  After discussion with the patient she had no further questions.  She was taken to the operating room.  After satisfactory general anesthesia had been obtained I used an alcohol prep and injected some ______ blue subareolarly as well as a little bit in the dermis directly over the guidewire site.  The breast was then prepped and draped as a sterile field.  I embarked upon a lumpectomy first after first marking the hot area in the axilla with a neoprobe.  The guidewire entered fairly high, almost in the axilla.  I made an elliptical incision to include the guidewire entry site and as the guidewire tracked from lateral to medial the  majority of the incision went medial to the guidewire entry site.  I raised the flap superiorly and really was getting basically into the axilla almost immediately.  As we got deep down, because we were really at the top of the breast here.  I readily got down to the muscle; and, as I was doing this, I saw a blue lymphatic tracing into the axilla.  As I was coming down to the muscle, I got a little close to where the tumor was and had to back out and take a little bit more.  I did the inferior flap similarly and was really trying to get wide inferiorly since, I thought, that most of the calcifications were likely to be in this direction tracking radially down towards the nipple areolar complex from the area in the tumor bed.  I continued the lumpectomy going around this area down to muscle including muscle fascia and then came under the full area and then disconnected it laterally.  By palpation, I was well around the tumor with mobile fascia deep to it and felt the superior aspect was well around it into fatty tissue of axilla.  However, there was not a large margin there. Inferiorly it was hard and I was concerned whether this was fibrocystic or some DCIS.  There was gross evidence of cystic disease and there were multiple small cysts encountered some of which were filled with more creamy material, others with more watery.  I elected to take another approximately 1.5 cm. thick piece of tissue along the inferior margin close  to where the tumor was since I was concerned about this area.  This was also done with the cautery.  Leads were electrocoagulated.  There appeared to be a single node directly adjacent in the axilla to where my work had already been done, and I rather would make a second incision just above the first one.  I just did a little dissection there and almost immediately identified a somewhat large blue node that was soft and this excised using cautery.  I did not palpate  any other nodes and a little brief dissection did not show any other blue nodes obvious, but using the neoprobe, I found just proximal to the first one another hot area and a little dissection revealed a small, perhaps 1 cm node that was not blue, that was excised and this one was very hot with counts up around 3000, the first one only had counts to around 1100.  Once that was removed there were no more "hot" areas and we did not get any counts of about 1500 in the axilla.  The wound was irrigated and checked for hemostasis.  I injected some Marcaine to try to help with his postoperative analgesia.  I put some clips to mark the margins of the excision noted.  The medial clip was really at the edge of the pectoralis though I had gone more medial than that on the excision.  I thought that this was really a good margin around where the tumor itself was.  The breast was reapproximated with some 3-0 Vicryl after loosening it a little bit inferiorly so it could get a little mobility pulled up and then the subcu with 3-0 Vicryl and the skin with 4-0 Monocryl subcuticular plus Steri-Strips.  The patient tolerated the procedure well.  There were no operative complications. All counts were correct.  Dr. Clelia Croft reported that the 2 sentinel nodes were negative for metastatic disease.  The margins also looked negative. Dictated by:   Currie Paris, M.D. Attending Physician:  Charlton Haws DD:  07/10/01 TD:  07/12/01 Job: [id number]

## 2010-09-15 NOTE — Op Note (Signed)
[place]  Patient:    [name], [name] Visit Number: [id number] MRN: [id number]          Service Type: DSU Location: [facility] Attending Physician:  Charlton Haws Dictated by:   Currie Paris, M.D. Proc. Date: 11/12/01 Admit Date:  11/12/2001 Discharge Date: 11/12/2001                             Operative Report  ACCOUNT NO. [id number]. CCS [id number].  PREOPERATIVE DIAGNOSIS:  Unneeded Port-A-Cath.  POSTOPERATIVE DIAGNOSIS:  Unneeded Port-A-Cath.  PROCEDURE:  Removal of Port-A-Cath.  SURGEON:  Currie Paris, M.D.  ANESTHESIA:  MAC.  HISTORY:  She is a 50 year old woman who has finished her chemotherapy and would like to have her Port-A-Cath removed.  DESCRIPTION OF PROCEDURE:  The patient was seen in the holding area and had no further questions.  She was taken to the operating room and given IV sedation. The area over the Port-A-Cath was prepped and draped as a sterile field. Xylocaine 1% was infiltrated for local and the old scar opened.  The Port-A-Cath was identified at the junction of the port and the tubing and dissected out so that I could see the tubing clearly.  I backed the tubing out a little bit and then put a 3-0 Vicryl across the tract of the tubing so that when I came all the way out I could tie that down.  The tubing backed out readily and the suture was tied down.  Using a knife, I opened the Port-A-Cath sac a little bit further and cut the holding Prolene sutures and pulled those out as well as the port.  I used the cautery to cauterize the base of the capsule around where the port was.  Once everything appeared to be dry, I closed the incision with 3-0 Vicryl, followed by 4-0 Monocryl subcuticular and Steri-Strips.  The patient tolerated the procedure well.  There were no operative complications.  All counts were correct.  At the end since she had had problems with the port, I inspected it.  The  tip appeared okay, as did the port, and there was no blood in the port.  I was able to irrigate through the port readily, and there did not appear to be any occlusion here. Dictated by:   Currie Paris, M.D. Attending Physician:  Charlton Haws DD:  11/12/01 TD:  11/15/01 Job: [id number]

## 2010-09-15 NOTE — H&P (Signed)
[facility]  Patient:    [name], [name] Visit Number: [id number] MRN: [id number]          Service Type: MED Location: 2S 6578 01 Attending Physician:  Jama Flavors Pearcy Dictated by:   Juanita Craver. Darrold Span, M.D. Admit Date:  10/28/2001   CC:         Currie Paris, M.D.  Margaretmary Dys, M.D.   History and Physical  DATE OF BIRTH:  [birth year]  HISTORY OF PRESENT ILLNESS:  The patient is a 50 year old white female with T1, N(IT), ER/PR positive, HER-2 3+ left breast carcinoma, admitted for fourth cycle of adjuvant AC chemotherapy, admission necessary due to refractory nausea and vomiting with outpatient chemotherapy.  The patient was most recently admitted to [facility] from June 10 to June 12 for her third adjuvant [facility] chemotherapy.  That hospitalization was complicated by difficulty accessing the Port-A-Cath, including difficulty despite the use of fluoroscopy.  The port was eventually accessed adequately and did function adequately through the remainder of the hospitalization.  The patient had severe constipation for about five days following the treatment, nausea and vomiting was controlled with the IV antiemetics and IV fluid, just felt that she did not regain her strength as quickly this last cycle.  She did receive Neulasta given at our office on July 12 and did not have any severe cytopenias.  She has had no fevers or symptoms of infection.  She has had some more upper respiratory congestion and drainage consistent with her known environmental allergies for the past two days, has been using Clarinex. Bowels are now back to normal.  Appetite has been good.  She has not had any discomfort from the Port-A-Cath.  The patient was diagnosed with left breast carcinoma as above in February 2003 with lumpectomy and two sentinel nodes then by Dr. Jamey Ripa.  GERD symptoms after the first cycle of chemotherapy have been improved with medications  for that.  Iron deficiency anemia at presentation (related to menstrual bleeding) has also improved with supplement.  She has not had a menstrual period now in almost two months.  She has not had any significant hot flashes.  PHYSICAL EXAMINATION:  GENERAL:  She is easily ambulatory about the office, looks rather anxious, but otherwise not in any obvious discomfort.  VITAL SIGNS:  Her weight is up one pound to 177, blood pressure 119/80, afebrile, heart rate 76 and regular, respirations not labored.  HEENT:  Alopecia.  Pupils equal and reactive.  She is not icteric.  Oral mucosa is clear.  Mucous membranes are moist.  Posterior pharynx has some minimal erythema laterally consistent with postnasal drainage.  NECK:  Supple.  No cervical, supraclavicular, bilateral, axillary adenopathy.  LUNGS:  Clear to the bases.  HEART:  Has a regular rate and rhythm.  BREASTS:  The left breast has a well-healed lumpectomy scar, no evidence of local recurrence, no swelling left upper extremity.  Right breast exam unchanged.  The port is nontender and without erythema, _________ in place.  HEART:  Has a regular rate and rhythm.  ABDOMEN:  Soft and nontender, some bowel sounds present.  No appreciable organomegaly or masses.  EXTREMITIES;  Lower extremities without edema, cords, or tenderness.  SKIN:  Not remarkable.  LABORATORY:  Today white count 4300, ANC of 2.9, hemoglobin 12.7, and platelets 293.  IMPRESSION/PLAN: 1. T1, N(IT) HER-2 3+ left breast carcinoma in premenopausal patient    with family history of breast cancer:  Admit for  fourth and last planned    adjuvant chemotherapy with [facility] today. 2. Shift in position of the Port-A-Cath causing difficulty with access during    the last admission.  We have decided to use fluoroscopy to access without    any other attempts this admission. 3. Iron deficiency anemia with hemoglobin up to 12.7 today. 4. Environmental allergies on Clarinex.   She is not interested in trying any    of the low dose steroid nose sprays.  CBC in the office today:  White count 4300, ANC of 2.4, hemoglobin 12.7, platelets 293. Dictated by:   Lennis P. Darrold Span, M.D. Attending Physician:  Mertie Clause DD:  10/28/01 TD:  10/29/01 Job: [id number]

## 2010-09-15 NOTE — Assessment & Plan Note (Signed)
[facility]                               PULMONARY OFFICE NOTE   LILIANNA, CASE                         MRN:          [id number]  DATE:12/07/2005                            DOB:          1960-09-14    PROBLEM:  Allergic rhinitis.   HISTORY:  Breast cancer (Dr. Darrold Span).   HISTORY:  One-year followup.  She says she has cough only if she has a cold.  She sneezes frequently but this is not a change in pattern.  Watery nose,  especially with meals.  Spring season is worst for her.  Her husband usually  gives her allergy shots.  We took time today to review risk, benefit and  goal considerations concerning allergy vaccine, including a discussion of  issues and policy related to administration away from a medical office,  anaphylaxis, EpiPen, and options.   MEDICATIONS:  Aspirin, Caltrate, tamoxifen 20 mg, allergy vaccine, EpiPen.   DRUG INTOLERANCE:  PENICILLIN.   OBJECTIVE:  VITAL SIGNS:  Weight 200 pounds.  BP 128/78, pulse rate 70, room  air saturation 97%.  HEENT:  Eyes, nose, and throat are clear.  She seems quite comfortable.  LUNGS:  Clear to P&A.  HEART:  Regular without murmur.   IMPRESSION:  Allergic rhinitis, under fair control.  There is either a  vasomotor rhinitis component or some breakthrough in allergic symptoms  occasionally.   PLAN:  We discussed and she signed the vaccine waiver.  EpiPen refilled with  discussion.  Try ipratropium 0.06% nasal spray for rhinorrhea and compare  with previously used measures.  Schedule return in one year but earlier  p.r.n. as discussed.  Continue environmental precautions including avoidance  of dust and external agents where possible.                                   Clinton D. Maple Hudson, MD, FCCP, FACP   CDY/MedQ  DD:  12/08/2005  DT:  12/09/2005  Job #:  [id number]   cc:   Quita Skye. Artis Flock, MD

## 2010-09-15 NOTE — H&P (Signed)
[facility]  Patient:    [name], LIKES Visit Number: [id number] MRN: [id number]          Service Type: MED Location: 2S 0254 01 Attending Physician:  Jama Flavors Pearcy Dictated by:   Juanita Craver. Darrold Span, M.D. Admit Date:  09/16/2001   CC:         Currie Paris, M.D.  Gretta Cool, M.D.  Margaretmary Dys, M.D.   History and Physical  DATE OF BIRTH:  July 26, 1960  HISTORY OF PRESENT ILLNESS:  The patient is a 50 year old white female with a T1a sentinel node positive by immunohistochemistry, HER2 3+ ER/PR positive left breast carcinoma diagnosed in February 2003, and admitted for second of planned four cycles of adjuvant Adriamycin and Cytoxan chemotherapy. Admission is necessary due to severe nausea and vomiting for 72 hours with the first chemotherapy given as an outpatient, this despite IV fluids and IV antiemetics daily at the office.  Nausea lasted a full seven days and esophagitis and gastritis more than seven days, which subsequently resolved with Nexium.  She had no constipation or diarrhea, did ______ and has felt well and back to full activity for the past two weeks.  REVIEW OF SYSTEMS:  No fever or symptoms of infection.  She has lost her hair. Appetite has been very good without any nausea this week.  Bowels have been moving regularly.  No problems with the Port-A-Cath.  No shortness of breath. No pain.  She has been able to sleep.  She did not have menstrual period on schedule this past week.  ALLERGIES:  PENICILLIN.  ENVIRONMENTAL allergies.  PAST MEDICAL HISTORY: 1. Previous UTI. 2. Port-A-Cath in place by Dr. Jamey Ripa. 3. Iron deficiency anemia secondary to menstrual blood loss which h.s. been    responding to oral iron.  FAMILY HISTORY:  Mother in her 36s with metastatic breast cancer 10 years following stage I diagnosis.  A brother died with congenital heart disease. One brother healthy.  One adopted  son.  SOCIAL HISTORY:  She is married.  Employed with the [employer].  No tobacco or alcohol.  Of note, husbands first wife died with metastatic breast cancer.  PHYSICAL EXAMINATION:  VITAL SIGNS:  Height 5 feet 8 inches, weight 175.  Blood pressure 113/72, afebrile, heart rate 85 and regular, respirations not labored.  HEENT:  Alopecia.  Pupils equal and reactive.  Nonicteric.  Oral mucosa and posterior pharynx are clear.  NECK:  Supple.  No cervical, supraclavicular adenopathy.  LUNGS:  Clear to the bases.  HEART:  Regular rate and rhythm.  Port-A-Cath right anterior chest wall is accessed, and the site is not remarkable.  BREASTS:  Right breast without dominant masses.  Nothing in the right axilla. Left breast has well-healed lumpectomy scar.  No dominant masses.  Nothing in the axilla.  No swelling of left upper extremity.  ABDOMEN:  Soft and nontender.  Bowel sounds are present.  No appreciable organomegaly or masses.  EXTREMITIES:  Lower extremities without edema.  LABORATORY DATA:  Done in the office prior to admission:  White count 3700, ANC 2.2, hemoglobin 11.8, platelets 299.  IMPRESSION AND PLAN: 1. T1 sentinel node positive by immunohistochemistry only, HER2 3+ left breast    carcinoma in a patient premenopausal at diagnosis, first outpatient    chemotherapy complicated by very refractory nausea and vomiting.  Admit for    continuous IV fluids, IV antiemetics for probably 24-48 hours until she is  stable after the second cycle of chemotherapy. 2. Iron deficiency anemia, responding to oral iron. 3. Port-A-Cath in place. 4. Environmental allergies. Dictated by:   Lennis P. Darrold Span, M.D. Attending Physician:  Mertie Clause DD:  09/16/01 TD:  09/17/01 Job: [id number]

## 2010-09-15 NOTE — Discharge Summary (Signed)
[facility]  Patient:    ANH, BIGOS Visit Number: [id number] MRN: [id number]          Service Type: MED Location: 2S [id number] Attending Physician:  Jama Flavors Pearcy Dictated by:   Juanita Craver. Darrold Span, M.D. Admit Date:  10/07/2001 Discharge Date: 10/09/2001   CC:         Currie Paris, M.D.   Discharge Summary  DATE OF BIRTH:  02-04-1961  HISTORY OF PRESENT ILLNESS:  The patient is a 50 year old white female admitted for third cycle of adjuvant Adriamycin/Cytoxan chemotherapy for left breast carcinoma, admission necessary due to extremely refractory nausea and vomiting with the first outpatient course of chemotherapy.  For details of her presentation course to this point, see the dictated H&P, review of systems, past medical history, family history, and social history as in the dictated H&P.  PHYSICAL EXAMINATION:  VITAL SIGNS:  On admission, weight 176 pounds, height 5 feet 8-1/4 inches, blood pressure 109/74, afebrile, heart rate 85 and regular, respirations unlabored.  HEENT:  Alopecia.  Pupils equal and reactive.  Oral mucosa, posterior pharynx clear.  NECK:  Supple.  No palpable peripheral adenopathy.  LUNGS:  Clear to the bases.  Port-A-Cath nontender and without erythema with no tenderness also along tubing tract superiorly.  BREASTS:  Left breast:  Well-healed lumpectomy scar without evidence of local recurrence, nothing in the left axilla.  Right breast:  Without masses, shin changes, nipple discharge, axilla benign.  HEART:  Regular rate and rhythm without murmurs or gallops.  ABDOMEN:  Soft and nontender.  No appreciable organomegaly or masses. Normoactive bowel sounds.  EXTREMITIES:  Lower extremities without edema.  LABORATORY DATA:  Laboratories in the office prior to admission:  White count 4900, ANC of 3.1, hemoglobin 12.1, platelets 400,000.  HOSPITAL COURSE:  The patient was admitted to the oncology  unit.  Chemistries on admission were remarkable only for a potassium of 3.4, which was supplemented.  There was significant difficulty accessing the Port-A-Cath initially.  I believe that three attempts were made by IV therapy without success, and then the patient taken to radiology where two physicians then made multiple attempts even with the fluoroscopy before finally adequately accessing the port with a 1.5-cm needle.  Contrast was injected, which revealed that the system was not obstructed.  The patients chemotherapy was then begun late in the evening of the day of admission due to the extremely long time required to get the port usable.  We had no further difficulty with the Port-A-Cath through the entire hospital stay.  She was treated with Adriamycin at 60 mg/sq m equals 114 mg IV and Cytoxan 600 mg/sq m equals 1140 mg IV.  Premedicated with Decadron 20 IV, Benadryl 25 IV, Zofran 16 IV, Ativan 1 mg IV, Protonix 40 mg IV.  She was given scheduled Ativan doses at 4 hours, 8 hours, and 12 hours beyond that and then p.r.n. Ativan and Zofran. IV fluids were continued and the patient was kept on clear liquids for over 24 hours.  She had some nausea and vomiting over the next two days; however, these were controlled with the interventions used and the patients hydration status was always good.  Followup chemistries were done, which showed electrolytes in normal range.  The situation with the Port-A-Cath was reviewed with IV therapy nurses prior to removal of the needle at the time of discharge.  Marking was placed and covered with Tegaderm at the site of the needle  and we would plan to reaccess with a long Huber needle under fluoroscopy for the last planned chemotherapy course, which will be given in three weeks.  The patient was discharged home in stable condition, able to take p.o. fluids on October 09, 2001.  DISCHARGE MEDICATIONS: 1. Ferrous gluconate 500 mg daily after all nausea  and vomiting has resolved. 2. Claritin as needed. 3. Valtrex 500 mg as instructed for genital herpes lesions on a p.r.n. basis,    not a problem presently. 4. Nexium 40 mg daily. 5. Ativan 1 mg q.4h. p.r.n. for nausea. 6. Zofran 16 mg q.d. p.r.n. for nausea.  FOLLOWUP:  The patient will followup with Dr. Darrold Span as scheduled prior to admission for last planned cycle of AC chemotherapy in three weeks.  IMPRESSION AND PLAN: 1. 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 2. Apparent shift in position of the Port-A-Cath causing significant    difficulties in access this admission, though the catheter worked with no    difficulty after access was obtained.  Plan as above.  We will probably    asked Dr. Jamey Ripa to remove the catheter as soon as we are finished with the    chemotherapy and her counts have recovered. Dictated by:   Lennis P. Darrold Span, M.D. Attending Physician:  Mertie Clause DD:  10/15/01 TD:  10/16/01 Job: [id number]

## 2010-09-15 NOTE — H&P (Signed)
NAME:  [name], [name]                  ACCOUNT NO.:  [id number]   MEDICAL RECORD NO.:  [id number]          PATIENT TYPE:  AMB   LOCATION:  [facility]                         FACILITY:  [facility]   PHYSICIAN:  Gretta Cool, M.D. 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   POSTOPERATIVE DIAGNOSIS:  Tamoxifen-induced endometrial hyperplasia and  polyp formation.   PROCEDURE:  1.  Hysteroscopy.  2.  Resection of enormous endometrial hyperplasia and polyp formation.   SURGEON:  Gretta Cool, M.D.   ANESTHESIA:  IV sedation and paracervical block.   DESCRIPTION OF PROCEDURE:  Under excellent anesthesia as above, with the  patient prepped and draped in lithotomy position and Allen stirrups with her  bladder drained, the cervix was progressively dilated with a series of Pratt  dilator to accommodate the 7-mm resectoscope.  The resectoscope was  introduced and the cavity was very difficult to identify because of the  enormous amount of endometrial hyperplasia and polyp formation.  On the  anterior wall of the uterus, there was an enormous polyp attached to the  right cornual area and virtually filling the cavity.  Many other smaller  polyps were also present.  It was very difficult to determine anatomy  because of the number of enormous polyps.  The cavity was treated by sharp  curettage and as much as possible of the endometrial tissue removed.  Next,  the endometrial cavity was then systematically resected starting with  resection of the enormous polyp in the anterior wall.  It was very difficult  to access it because of its depth in the pelvis.  Virtually all of the  hysteroscope was in the patient's vagina and uterus, making manipulation  very difficult.  A very high position on the anterior wall of the uterus  made it very difficult to see also because of the gas bubbles formed,  obstructing view.  Eventually, all of the polyp was resected.  The entire  endometrial cavity was also resected 360 degrees around.  The cornual areas  were treated by touch technique with VaporTrode and the entire cavity was  then also treated with VaporTrode so as to eliminate viable islands of  endometrial tissue that could not be identified in the superficial  myometrium.   The entire cavity was surveyed at the end of the procedure and there was no  evidence of residual viable endometrial tissue.  The entire cavity was 2 or  3 times normal size of the endometrial cavity, however, and it was very  difficult to  assess the anterior uterine wall at the apex of the fundus because of the  position of the uterus and bubble formation.  At this point, the procedure  was terminated without complication and the patient returned to the recovery  room in excellent condition.           ______________________________  Gretta Cool, M.D.     CWL/MEDQ  D:  08/30/2005  T:  08/30/2005  Job:  [id number]   cc:   Lennis P. Darrold Span, M.D.  Fax: [phone]   Currie Paris, M.D.  [address], [address]  [place]  [place] [address]   Quita Skye. Artis Flock, M.D.  Fax: [phone]

## 2010-09-15 NOTE — Op Note (Signed)
NAME:  [name], [name]                  ACCOUNT NO.:  [id number]   MEDICAL RECORD NO.:  [id number]          PATIENT TYPE:  OIB   LOCATION:  1617                         FACILITY:  [facility]   PHYSICIAN:  Gretta Cool, M.D. DATE OF BIRTH:  Dec 07, 1960   DATE OF PROCEDURE:  DATE OF DISCHARGE:  08/13/2006                               OPERATIVE REPORT   PREOPERATIVE DIAGNOSIS:  Abnormal uterine bleeding secondary to  recurrent endometrial polyps, tamoxifen induced on chemotherapy for  breast disease.   POSTOPERATIVE DIAGNOSIS:  Abnormal uterine bleeding secondary to  recurrent endometrial polyps, tamoxifen induced on chemotherapy for  breast disease.   PROCEDURE:  Supracervical hysterectomy and bilateral salpingo-  oophorectomy.   SURGEON:  Dr. Beather Arbour.   ASSISTANT:  Dr. Jeanine Luz.   ANESTHESIA:  General orotracheal.   DESCRIPTION OF PROCEDURE:  Under excellent general anesthesia, with the  patient prepped and draped in lithotomy position, with Foley catheter  draining bladder, a Pfannenstiel incision was made and extended through  the fascia.  The peritoneum was then opened and the abdomen explored.  No abnormalities identified in the upper abdomen.  Examination of the  pelvis and uterus revealed a large boggy uterus, both ovaries appeared  active.  There was extensive endometriosis over the anterior peritoneal  surface of the bladder flap and anterior uterine wall.  There was  significant endometriosis, posteriorly, as well.  There was no deeply  invasive disease, all superficial disease.  At this point, the round  ligaments were transected by cautery and the anterior leaf of the broad  ligament opened and the bladder pushed off the lower uterine segment.  The infundibulopelvic ligaments were then opened on each side, clamped,  cut, sutured and tied with zero Vicryl.  The uterine vessels were then  skeletonized, clamped, sutured and tied with zero Vicryl.  The upper  portion of the cardinal was then clamped cut, sutured and tied.  At this  point, the cervix was incised in a conical fashion so as to remove most  of the endocervical canal, but to leave the structure of the cervix  intact.  Once the uterus was excised, the endocervical canal was treated  by cautery to eliminate any viable endometrial glands, endometrial  tissue remaining.  At this point, the cervix was closed with a running  suture of zero Vicryl in a mattress-type closure.  At this point, all  the bleeding was well-controlled.  The pelvic peritoneum was closed with  a running suture of #2-0 Vicryl.  At this point, the packs and  retractors were removed.  The abdominal peritoneum was closed with  running suture of #0 Vicryl.  The rectus muscles were plicated in the  midline with zero Vicryl and the fascia closed with zero Vicryl from  each angle to the midline.  The subcutaneous tissues, including  Scarpa's fascia, was then approximated with interrupted sutures of 3-0  Vicryl, and the skin was closed with skin staples and Steri-Strips.  At  the end of the procedure, sponge and lap counts were correct.  No  complications.  The  patient returned to the recovery room in excellent  condition.           ______________________________  Gretta Cool, M.D.     CWL/MEDQ  D:  08/14/2006  T:  08/14/2006  Job:  [id number]   cc:   Almedia Balls. Randell Patient, M.D.  Fax: [phone]   Lennis P. Darrold Span, M.D.  Fax: [phone]   Quita Skye. Artis Flock, M.D.  Fax: [phone]   Gretta Cool, M.D.  Fax: [phone]

## 2010-09-18 ENCOUNTER — Encounter (HOSPITAL_COMMUNITY): Payer: 59

## 2010-09-20 ENCOUNTER — Encounter (HOSPITAL_COMMUNITY): Payer: 59

## 2010-09-22 ENCOUNTER — Encounter (HOSPITAL_COMMUNITY): Payer: 59

## 2010-09-25 ENCOUNTER — Encounter (HOSPITAL_COMMUNITY): Payer: 59

## 2010-09-27 ENCOUNTER — Encounter (HOSPITAL_COMMUNITY): Payer: 59

## 2010-09-29 ENCOUNTER — Encounter (HOSPITAL_COMMUNITY): Payer: 59

## 2010-10-02 ENCOUNTER — Encounter (HOSPITAL_COMMUNITY): Payer: 59

## 2010-10-02 ENCOUNTER — Encounter (HOSPITAL_COMMUNITY): Payer: 59 | Attending: Cardiology

## 2010-10-02 DIAGNOSIS — I214 Non-ST elevation (NSTEMI) myocardial infarction: Secondary | ICD-10-CM | POA: Insufficient documentation

## 2010-10-02 DIAGNOSIS — Z5189 Encounter for other specified aftercare: Secondary | ICD-10-CM | POA: Insufficient documentation

## 2010-10-02 DIAGNOSIS — E785 Hyperlipidemia, unspecified: Secondary | ICD-10-CM | POA: Insufficient documentation

## 2010-10-02 DIAGNOSIS — I10 Essential (primary) hypertension: Secondary | ICD-10-CM | POA: Insufficient documentation

## 2010-10-02 DIAGNOSIS — I201 Angina pectoris with documented spasm: Secondary | ICD-10-CM | POA: Insufficient documentation

## 2010-10-02 DIAGNOSIS — Z853 Personal history of malignant neoplasm of breast: Secondary | ICD-10-CM | POA: Insufficient documentation

## 2010-10-04 ENCOUNTER — Encounter (HOSPITAL_COMMUNITY): Payer: 59

## 2010-10-06 ENCOUNTER — Encounter (HOSPITAL_COMMUNITY): Payer: 59

## 2010-10-09 ENCOUNTER — Encounter (HOSPITAL_COMMUNITY): Payer: 59

## 2010-10-11 ENCOUNTER — Encounter (HOSPITAL_COMMUNITY): Payer: 59

## 2010-10-13 ENCOUNTER — Encounter (HOSPITAL_COMMUNITY): Payer: 59

## 2010-10-16 ENCOUNTER — Encounter (HOSPITAL_COMMUNITY): Payer: 59

## 2010-10-18 ENCOUNTER — Encounter (HOSPITAL_COMMUNITY): Payer: 59

## 2010-10-20 ENCOUNTER — Encounter (HOSPITAL_COMMUNITY): Payer: 59

## 2010-10-23 ENCOUNTER — Encounter (HOSPITAL_COMMUNITY): Payer: 59

## 2010-10-25 ENCOUNTER — Encounter (HOSPITAL_COMMUNITY): Payer: 59

## 2010-10-27 ENCOUNTER — Encounter (HOSPITAL_COMMUNITY): Payer: 59

## 2010-10-27 ENCOUNTER — Encounter (HOSPITAL_COMMUNITY): Payer: 59 | Attending: Cardiology

## 2010-10-27 DIAGNOSIS — I201 Angina pectoris with documented spasm: Secondary | ICD-10-CM | POA: Insufficient documentation

## 2010-10-27 DIAGNOSIS — Z853 Personal history of malignant neoplasm of breast: Secondary | ICD-10-CM | POA: Insufficient documentation

## 2010-10-27 DIAGNOSIS — Z5189 Encounter for other specified aftercare: Secondary | ICD-10-CM | POA: Insufficient documentation

## 2010-10-27 DIAGNOSIS — I214 Non-ST elevation (NSTEMI) myocardial infarction: Secondary | ICD-10-CM | POA: Insufficient documentation

## 2010-10-27 DIAGNOSIS — I10 Essential (primary) hypertension: Secondary | ICD-10-CM | POA: Insufficient documentation

## 2010-10-27 DIAGNOSIS — E785 Hyperlipidemia, unspecified: Secondary | ICD-10-CM | POA: Insufficient documentation

## 2010-10-30 ENCOUNTER — Ambulatory Visit (HOSPITAL_COMMUNITY): Payer: 59

## 2010-12-13 ENCOUNTER — Encounter: Payer: Self-pay | Admitting: Cardiology

## 2011-02-07 ENCOUNTER — Other Ambulatory Visit: Payer: Self-pay | Admitting: Cardiology

## 2011-02-07 MED ORDER — ATORVASTATIN CALCIUM 10 MG PO TABS: 10.0000 mg | ORAL_TABLET | Freq: Every day | ORAL | Status: DC

## 2011-02-07 MED ORDER — AMLODIPINE BESYLATE 5 MG PO TABS: 5.0000 mg | ORAL_TABLET | Freq: Every day | ORAL | Status: DC

## 2011-03-14 ENCOUNTER — Ambulatory Visit (INDEPENDENT_AMBULATORY_CARE_PROVIDER_SITE_OTHER): Payer: 59 | Admitting: Cardiology

## 2011-03-14 ENCOUNTER — Encounter: Payer: Self-pay | Admitting: Cardiology

## 2011-03-14 DIAGNOSIS — I214 Non-ST elevation (NSTEMI) myocardial infarction: Secondary | ICD-10-CM

## 2011-03-14 DIAGNOSIS — I10 Essential (primary) hypertension: Secondary | ICD-10-CM

## 2011-03-14 DIAGNOSIS — E785 Hyperlipidemia, unspecified: Secondary | ICD-10-CM

## 2011-03-14 NOTE — Assessment & Plan Note (Signed)
Blood pressure is under good control. No change in therapy 

## 2011-03-14 NOTE — Assessment & Plan Note (Signed)
The patient did have a cardiac event in February 2 012.  We know her coronaries are normal.  There has been mention of possible vasospasm.  On this basis I would continue aspirin unless there is a contraindication.  I would continue low-dose amlodipine as it gives her excellent blood pressure control.  Also I do think it is appropriate to be aggressive with her primary prevention including the use of a statin.  Very Careful and long discussion with the patient today about the use of these medicines. I explained to her that the question of coronary spasm was a diagnosis of exclusion.  I explained to her also that there is no hard data concerning the use of the medications for her including aspirin, amlodipine, or statin.  It is felt that since there was some type of cardiac event that aggressive primary prevention is appropriate.  She will continue the medications for now.

## 2011-03-14 NOTE — Assessment & Plan Note (Signed)
As noted above the patient will remain on a low dose of a statin at this time.  She will check to be sure that her recent labs including liver functions.  The cardiac point she's stable.  She does not need followup with me.  Be happy to see her anytime.

## 2011-03-14 NOTE — Progress Notes (Signed)
HPI   Iatient returns today for followup of cardiac status.She is actually doing well.  She nonsystemic earlier this year.  Catheterization revealed normal coronaries.  There was question of coronary spasm.  He was placed on amlodipine.  She's also been continued on a statin to be aggressive with her very prevention.  I know that her recent labs by her report showed that her LDL was good.  She will check to see if her liver functions were assessed.  Allergies  Allergen Reactions  . Penicillins     REACTION: swelling    Current Outpatient Prescriptions  Medication Sig Dispense Refill  . amLODipine (NORVASC) 5 MG tablet Take 1 tablet (5 mg total) by mouth daily.  30 tablet  7  . aspirin EC 81 MG tablet Take 1 tablet (81 mg total) by mouth daily.  150 tablet  2  . atorvastatin (LIPITOR) 10 MG tablet Take 1 tablet (10 mg total) by mouth daily.  30 tablet  7  . calcium carbonate (OS-CAL) 600 MG TABS Take 600 mg by mouth 2 (two) times daily with a meal.        . Cholecalciferol (VITAMIN D3) 2000 UNITS TABS 1 1/2 tab po qd      . ESTRING 2 MG vaginal ring 1 Units by Intrauterine route Every 3 months.      Marland Kitchen ipratropium (ATROVENT) 0.06 % nasal spray 1-2 sprays each nostril up to 4 times daily, before meals, as needed  15 mL  1  . loratadine (CLARITIN) 10 MG tablet Take 10 mg by mouth daily. As needed        History   Social History  . Marital Status: Married    Spouse Name: N/A    Number of Children: 1  . Years of Education: N/A   Occupational History  . SUPERVISOR [employer]   Social History Main Topics  . Smoking status: Never Smoker   . Smokeless tobacco: Not on file  . Alcohol Use: Not on file     2 per month  . Drug Use: Not on file  . Sexually Active: Not on file   Other Topics Concern  . Not on file   Social History Narrative  . No narrative on file    Family History  Problem Relation Age of Onset  . Cancer Mother     breast  . Diabetes Mother   . Arthritis  Mother   . Clotting disorder Mother   . Heart attack      sibling  . Stroke      sibling    Past Medical History  Diagnosis Date  . Breast cancer     left breast ca, lumpectomy, chemotherapy, radiation.  2003  . Allergic rhinitis   . Non-STEMI (non-ST elevated myocardial infarction)     June 23, 2010, presumed vasospasm in the absence CAD at catheter  . Hypertension   . Dyslipidemia     Past Surgical History  Procedure Date  . Breast lumpectomy     Left  . Total abdominal hysterectomy 2008    ROS    Patient denies fever, chills, headache, sweats, rash, change in vision, change in hearing, chest pain, cough, nausea vomiting, urinary symptoms.  All other systems are reviewed and are negative.   PHYSICAL EXAM   Patient is oriented to person time and place.  Affect is normal.  Head is atraumatic.  There is no jugular venous distention.  Lungs are clear.  Respiratory effort  is nonlabored.  Cardiac exam reveals muscle and S2.  There are no clicks or significant murmurs.  Abdomen is soft.  There is no peripheral edema.  No musculoskeletal deformities.  There are no skin rashes. Filed Vitals:   03/14/11 0955  BP: 123/87  Pulse: 77  Resp: 18  Height: 5\' 7"  (1.702 m)  Weight: 225 lb 1.9 oz (102.114 kg)    EKG is done today reviewed by me.  There is no significant abnormality.  ASSESSMENT & PLAN

## 2011-03-14 NOTE — Patient Instructions (Signed)
Your physician recommends that you schedule a follow-up appointment in: as needed Continue current medications Contact Jaelyn Bourgoin [phone] about your liver function blood work

## 2011-04-17 ENCOUNTER — Encounter: Payer: Self-pay | Admitting: *Deleted

## 2011-04-20 ENCOUNTER — Ambulatory Visit: Payer: 59 | Admitting: Oncology

## 2011-04-20 ENCOUNTER — Other Ambulatory Visit: Payer: 59

## 2011-05-22 ENCOUNTER — Telehealth: Payer: Self-pay | Admitting: Oncology

## 2011-05-22 ENCOUNTER — Ambulatory Visit (HOSPITAL_BASED_OUTPATIENT_CLINIC_OR_DEPARTMENT_OTHER): Payer: 59 | Admitting: Oncology

## 2011-05-22 ENCOUNTER — Other Ambulatory Visit (HOSPITAL_BASED_OUTPATIENT_CLINIC_OR_DEPARTMENT_OTHER): Payer: 59 | Admitting: Lab

## 2011-05-22 VITALS — BP 132/88 | HR 67 | Temp 97.6°F | Ht 67.0 in | Wt 225.8 lb

## 2011-05-22 DIAGNOSIS — C50919 Malignant neoplasm of unspecified site of unspecified female breast: Secondary | ICD-10-CM

## 2011-05-22 DIAGNOSIS — Z1231 Encounter for screening mammogram for malignant neoplasm of breast: Secondary | ICD-10-CM

## 2011-05-22 DIAGNOSIS — K5900 Constipation, unspecified: Secondary | ICD-10-CM

## 2011-05-22 DIAGNOSIS — R635 Abnormal weight gain: Secondary | ICD-10-CM

## 2011-05-22 LAB — CBC WITH DIFFERENTIAL/PLATELET
Basophils Absolute: 0 10*3/uL (ref 0.0–0.1)
Eosinophils Absolute: 0.1 10*3/uL (ref 0.0–0.5)
HCT: 40.5 % (ref 34.8–46.6)
HGB: 13.6 g/dL (ref 11.6–15.9)
LYMPH%: 38.7 % (ref 14.0–49.7)
MONO#: 0.4 10*3/uL (ref 0.1–0.9)
NEUT#: 2.5 10*3/uL (ref 1.5–6.5)
NEUT%: 50.3 % (ref 38.4–76.8)
Platelets: 223 10*3/uL (ref 145–400)
WBC: 5 10*3/uL (ref 3.9–10.3)

## 2011-05-22 LAB — COMPREHENSIVE METABOLIC PANEL
CO2: 25 mEq/L (ref 19–32)
Creatinine, Ser: 0.72 mg/dL (ref 0.50–1.10)
Glucose, Bld: 87 mg/dL (ref 70–99)
Sodium: 139 mEq/L (ref 135–145)
Total Bilirubin: 0.5 mg/dL (ref 0.3–1.2)
Total Protein: 7 g/dL (ref 6.0–8.3)

## 2011-05-22 NOTE — Telephone Encounter (Signed)
Made appt for 05/06/12 for pt,pt put info in phone.  Lm at breast center for them to call her with yearly mammo screening  aom

## 2011-05-22 NOTE — Progress Notes (Signed)
OFFICE PROGRESS NOTE  Date of Visit   May 22, 2011 Physicians: R.Tisovec, C.Streck, J.Medoff, C.Lomax, J.Katz  INTERVAL HISTORY:   Patient is seen, together with husband, in yearly follow up of her history of breast cancer, that diagnosed in 2003 when she was premenopausal.  She had T1 involvement with micrometastatic involvement of sentinel node at left lumpectomy and sentinel node evaluation, 4 cycles of adriamycin/cytoxan, local radiation, Tamoxifen from Aug 2003 thru April 2007, then hysterectomy with BSO in April 2008 followed by Arimidex from June 2008 thru June 2011. She is presently on observation. She will be due mammograms at [facility] in late March (last 07-24-10) and had MRI breast 08-18-10 which will likely be done again in 2014.   In Feb 2012 patient was evaluated by cardiology for acute chest pain, with elevated CK and troponin, negative cath and no MI, thought secondary to coronary artery spasm. CT angio chest was negative for PE. She now has just prn follow up with Dr.Katz. She has had no further chest pain.   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 She is hopeful that this will allow her to get more regular exercise and improve diet. She has had no recent infectious illness, refuses flu vaccine and is aware from our conversation that tamiflu within first 48 hrs could help if she develops flu symptoms. She denies shortness of breath or other respiratory symptoms, any change on breast self exam, any cardiac symptoms. She has had more constipation in past 2 months, bowels moving just weekly and very painful without noted blood; she has been off magnesium since summer. She is scheduled for colonoscopy by [facility] upcoming.  Remainder of 10 point Review of Systems negative/ unchanged.  Aniza and her husband have a new granddaughter Almyra Free, 7 mo.  Objective:  Vital signs in last 24 hours:  BP 132/88   Pulse 67  Temp(Src) 97.6 F (36.4 C) (Oral)  Ht 5\' 7"  (1.702 m)  Wt 225 lb 12.8 oz (102.422 kg)  BMI 35.37 kg/m2 This weight is up 8 lbs since Dec 1011.  ALert, talkative, looks comfortable. Obese.  HEENT:mucous membranes moist, pharynx normal without lesions. PERRL. No thyroid mass appreciated. Neck supple LymphaticsCervical, supraclavicular, and axillary nodes normal.No inguinal adenopathy Resp: clear to auscultation bilaterally and normal percussion bilaterally Cardio: regular rate and rhythm.  GI: soft, non-tender; bowel sounds normal; no masses,  no organomegaly Extremities: extremities normal, atraumatic, no cyanosis or edema Neuro:nonfocal Breasts: left with well-healed lumpectomy scar, no dominant masses, no skin changes of concern, no nipple changes, axilla benign, no swelling LUE.  Right breast and axilla likewise unremarkable.  Lab Results:   [facility] 05/22/11 1207  WBC 5.0  HGB 13.6  HCT 40.5  PLT 223  ANC 2.5 Differential not remarkable  BMET  Basename 05/22/11 1207  NA 139  K 3.9  CL 103  CO2 25  GLUCOSE 87  BUN 13  CREATININE 0.72  CALCIUM 9.2   Remainder of full CMET WNL Studies/Results:  No results found.  Medications: I have reviewed the patient's current medications.  Assessment/Plan:  1. T1N1 left breast carcinoma diagnosed 2003, post treatment as above and now on observation. Mammograms will be in March. I will see her back in a year or sooner if needed 2. 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

## 2011-06-21 ENCOUNTER — Telehealth: Payer: Self-pay | Admitting: Cardiology

## 2011-06-21 NOTE — Telephone Encounter (Signed)
LOV,12,Cath,H&P,Dicharge faxed to [facility]  @ [phone]  06/21/11/KM

## 2011-08-08 ENCOUNTER — Ambulatory Visit
Admission: RE | Admit: 2011-08-08 | Discharge: 2011-08-08 | Disposition: A | Discharge status: Home / Self Care | Payer: 59 | Source: Ambulatory Visit | Attending: Oncology | Admitting: Oncology

## 2011-08-08 DIAGNOSIS — Z1231 Encounter for screening mammogram for malignant neoplasm of breast: Secondary | ICD-10-CM

## 2011-08-16 ENCOUNTER — Ambulatory Visit: Payer: 59 | Admitting: Internal Medicine

## 2011-09-05 ENCOUNTER — Other Ambulatory Visit: Payer: Self-pay | Admitting: Gynecology

## 2011-09-06 ENCOUNTER — Ambulatory Visit: Payer: 59 | Admitting: Internal Medicine

## 2011-09-28 ENCOUNTER — Encounter: Payer: Self-pay | Admitting: Internal Medicine

## 2011-09-28 ENCOUNTER — Ambulatory Visit (INDEPENDENT_AMBULATORY_CARE_PROVIDER_SITE_OTHER): Payer: 59 | Admitting: Internal Medicine

## 2011-09-28 VITALS — BP 120/98 | HR 82 | Ht 67.0 in | Wt 220.6 lb

## 2011-09-28 DIAGNOSIS — J309 Allergic rhinitis, unspecified: Secondary | ICD-10-CM

## 2011-09-28 DIAGNOSIS — J209 Acute bronchitis, unspecified: Secondary | ICD-10-CM

## 2011-09-28 DIAGNOSIS — J302 Other seasonal allergic rhinitis: Secondary | ICD-10-CM

## 2011-09-28 MED ORDER — IPRATROPIUM BROMIDE 0.06 % NA SOLN: NASAL | Status: DC

## 2011-09-28 NOTE — Progress Notes (Signed)
  Subjective:    Patient ID: [name], female    DOB: [birth year], 51 y.o.   MRN: [id number]  HPI 89 yoF followed for allergic rhinitis and bronchitis with complicating hx of breast cancer, MI, HTN. She complains of a chronic pattern of watery rhinorhea with meals, sounding separate from her seasonal rhinitis. Uses Nasonex for seasonal stuffy rhinitis, but trial Astepro made her too sleepy. Allegra-d works best, but with hx of MI she uses it very sparingly. Has encasings on bedding.  09/28/11 51 yoF followed for allergic rhinitis and bronchitis with complicating hx of breast cancer, MI, HTN.  c/o head congestion, sore throat and post nasal drip.  Ipratropium nasal spray helps nasal drip with meals. Stopped Nasonex. Nasal stuffiness every night as she lies down. Bad snore and drainage worst supine. Mouth breather. Not told of apnea but she ordered TV oral appliance. Failed nasal strips.  ROS-see HPI Constitutional:   No-   weight loss, night sweats, fevers, chills, fatigue, lassitude. HEENT:   No-  headaches, difficulty swallowing, tooth/dental problems, sore throat,      +sneezing, itching, ear ache, nasal congestion, post nasal drip,  CV:  No-   chest pain, orthopnea, PND, swelling in lower extremities, anasarca, dizziness, palpitations Resp: No-   shortness of breath with exertion or at rest.              No-   productive cough,  No non-productive cough,  No- coughing up of blood.              No-   change in color of mucus.  No- wheezing.   Skin: No-   rash or lesions. GI:  No-   heartburn, indigestion, abdominal pain, nausea, vomiting,  GU:  MS:  No-   joint pain or swelling.   Neuro-     nothing unusual Psych:  No- change in mood or affect. No depression or anxiety.  No memory loss.  OBJ- Physical Exam General- Alert, Oriented, Affect-appropriate, Distress- none acute,  Skin- rash-none, lesions- none, excoriation- none Lymphadenopathy- none Head- atraumatic            Eyes- Gross  vision intact, PERRLA, conjunctivae and secretions clear            Ears- Hearing, canals-normal            Nose- mucus, no-Septal dev,  polyps, erosion, perforation             Throat- Mallampati III , mucosa red , drainage- none, tonsils- atrophic Neck- flexible , trachea midline, no stridor , thyroid nl, carotid no bruit Chest - symmetrical excursion , unlabored           Heart/CV- RRR , no murmur , no gallop  , no rub, nl s1 s2                           - JVD- none , edema- none, stasis changes- none, varices- none           Lung- clear to P&A, wheeze- none, cough- none , dullness-none, rub- none           Chest wall-  Abd-  Br/ Gen/ Rectal- Not done, not indicated Extrem- cyanosis- none, clubbing, none, atrophy- none, strength- nl Neuro- grossly intact to observation

## 2011-09-28 NOTE — Patient Instructions (Signed)
Script sent to refill the ipratropium nasal spray  Sample Dymista nasal spray  Try using 1-2 puffs each nostril every night at bedtime  Try otc decongestant phenylephrine     Sudafed-PE and other brands  Ok to continue with Allegra-D, Mucinex-D, saline rinses as you find helpful  Watch out for reflux of stomach juice while you are sleeping at night- Dr Wylene Simmer can help with this if needed

## 2011-10-04 NOTE — Assessment & Plan Note (Signed)
Good control for now 

## 2011-10-04 NOTE — Assessment & Plan Note (Addendum)
Sample Dymista, try sudafed-PE, saline rinse and use either Allegra-D or Mucinex-D as alternatives as discussed.  Educated on snoring/ sleep apnea to watch.

## 2011-10-05 ENCOUNTER — Encounter (INDEPENDENT_AMBULATORY_CARE_PROVIDER_SITE_OTHER): Payer: Self-pay | Admitting: General Surgery

## 2011-10-08 ENCOUNTER — Encounter (INDEPENDENT_AMBULATORY_CARE_PROVIDER_SITE_OTHER): Payer: Self-pay | Admitting: Surgery

## 2011-10-08 ENCOUNTER — Ambulatory Visit (INDEPENDENT_AMBULATORY_CARE_PROVIDER_SITE_OTHER): Payer: 59 | Admitting: Surgery

## 2011-10-08 VITALS — BP 122/90 | HR 80 | Temp 97.6°F | Resp 14 | Ht 67.0 in | Wt 219.4 lb

## 2011-10-08 DIAGNOSIS — Z853 Personal history of malignant neoplasm of breast: Secondary | ICD-10-CM

## 2011-10-08 NOTE — Progress Notes (Signed)
NAME: [name]       DOB: 02-Oct-1960           DATE: 10/08/2011       MRN: [id number]   [name] is a 51 y.o.Marland Kitchenfemale who presents for routine followup of her Left IDC, stage I triple + diagnosed in 2003 and treated with lumpectomy, SLN, chemo and radiation. She has no problems or concerns on either side.  PFSH: She has had no significant changes since the last visit here.  ROS: There have been no significant changes since the last visit here  EXAM:  VS: BP 122/90  Pulse 80  Temp(Src) 97.6 F (36.4 C) (Temporal)  Resp 14  Ht 5\' 7"  (1.702 m)  Wt 219 lb 6.4 oz (99.519 kg)  BMI 34.36 kg/m2   General: The patient is alert, oriented, generally healty appearing, NAD. Mood and affect are normal.  Breasts:  Left somewhat smaller than right due to surgery/radiation. No evidence for recurrence and normal right side  Lymphatics: She has no axillary or supraclavicular adenopathy on either side.  Extremities: Full ROM of the surgical side with no lymphedema noted.  Data Reviewed: Mammogram in Jan. Was OK  Impression: Doing well, with no evidence of recurrent cancer or new cancer  Plan: She is 10 years out and follows with Dr Darrold Span. Will see here prn.

## 2011-10-08 NOTE — Patient Instructions (Signed)
We will see you again on an as needed basis. Please call the office at [phone] if you have any questions or concerns. Thank you for allowing us to take care of you.  

## 2012-05-06 ENCOUNTER — Encounter: Payer: Self-pay | Admitting: Oncology

## 2012-05-06 ENCOUNTER — Ambulatory Visit (HOSPITAL_BASED_OUTPATIENT_CLINIC_OR_DEPARTMENT_OTHER): Payer: 59 | Admitting: Oncology

## 2012-05-06 ENCOUNTER — Other Ambulatory Visit (HOSPITAL_BASED_OUTPATIENT_CLINIC_OR_DEPARTMENT_OTHER): Payer: 59 | Admitting: Lab

## 2012-05-06 DIAGNOSIS — Z853 Personal history of malignant neoplasm of breast: Secondary | ICD-10-CM

## 2012-05-06 DIAGNOSIS — R634 Abnormal weight loss: Secondary | ICD-10-CM

## 2012-05-06 DIAGNOSIS — Z23 Encounter for immunization: Secondary | ICD-10-CM

## 2012-05-06 DIAGNOSIS — Z1231 Encounter for screening mammogram for malignant neoplasm of breast: Secondary | ICD-10-CM

## 2012-05-06 DIAGNOSIS — C50919 Malignant neoplasm of unspecified site of unspecified female breast: Secondary | ICD-10-CM

## 2012-05-06 LAB — CBC WITH DIFFERENTIAL/PLATELET
Eosinophils Absolute: 0.1 10*3/uL (ref 0.0–0.5)
LYMPH%: 31.6 % (ref 14.0–49.7)
MONO#: 0.3 10*3/uL (ref 0.1–0.9)
NEUT#: 2.6 10*3/uL (ref 1.5–6.5)
Platelets: 210 10*3/uL (ref 145–400)
RBC: 4.61 10*6/uL (ref 3.70–5.45)
WBC: 4.5 10*3/uL (ref 3.9–10.3)

## 2012-05-06 LAB — COMPREHENSIVE METABOLIC PANEL (CC13)
Albumin: 3.5 g/dL (ref 3.5–5.0)
CO2: 26 mEq/L (ref 22–29)
Calcium: 9.6 mg/dL (ref 8.4–10.4)
Chloride: 109 mEq/L — ABNORMAL HIGH (ref 98–107)
Glucose: 96 mg/dl (ref 70–99)
Potassium: 4.1 mEq/L (ref 3.5–5.1)
Sodium: 141 mEq/L (ref 136–145)
Total Bilirubin: 0.57 mg/dL (ref 0.20–1.20)
Total Protein: 7.1 g/dL (ref 6.4–8.3)

## 2012-05-06 MED ORDER — INFLUENZA VIRUS VACC SPLIT PF IM SUSP
0.5000 mL | Freq: Once | INTRAMUSCULAR | Status: AC
  Administered 2012-05-06: 0.5 mL via INTRAMUSCULAR
  Filled 2012-05-06: qty 0.5

## 2012-05-06 NOTE — Progress Notes (Signed)
OFFICE PROGRESS NOTE   05/06/2012   Physicians: R.Tisovec, (C.Streck), J.Medoff, C.Lomax, (J.Katz), C.Young   INTERVAL HISTORY:  Patient is seen, together with husband, in yearly follow up of her history of left breast cancer, now on observation thru this office. Primary care is by Dr Wylene Simmer, whom she will see again in spring, and she is still followed by Dr Nicholas Lose.  She now is on prn follow up with Drs Jamey Ripa and Myrtis Ser.  History is of T1 with micrometastatic involvement of sentinel node at left lumpectomy and sentinel node evaluation, 4 cycles of adriamycin/cytoxan, local radiation, Tamoxifen from Aug 2003 thru April 2007, then hysterectomy with BSO in April 2008 followed by Arimidex from June 2008 thru June 2011. Most recent bilateral mammograms were at [facility] 08-08-11, breast tissue still heterogeneously dense; last breast MRI was April 2012. Family history is significant for mother dying of metastatic breast cancer (postmenopausal).  Patient has lost 30 lbs since Aug 2013 on [place] diet; she hopes to lose another 30 lbs by the time that she sees Dr Wylene Simmer in spring. Her husband is also on the diet and has also lost weight. She had sinus infection in Oct, no other infectious illness. She has not had flu shot, but is willing to get that today. She denies respiratory, cardiac, GI, breast, hematologic, musculoskeletal problems.  Remainder of 10 point Review of Systems negative.  Year old step granddaughter lives in [place]. [name] is doing much better overall since she has been working day shift.  Objective:  Vital signs in last 24 hours:  Weight 196.7(down from 225 lb/12 oz in Jan 2-13), resp 18 not labored RA, Temp 97, BP 132/81, HR 72 regular.   HEENT:PERRLA, extra ocular movement intact, sclera clear, anicteric, oropharynx clear, no lesions and neck supple with midline trachea. Normal hair pattern LymphaticsCervical, supraclavicular, and axillary nodes normal. Resp: clear  to auscultation bilaterally and normal percussion bilaterally Back nontender Cardio: regular rate and rhythm GI: soft, non-tender; bowel sounds normal; no masses,  no organomegaly Extremities: extremities normal, atraumatic, no cyanosis or edema Neuro:nonfocal Breasts: left with well healed lumpectomy scar, no dominant mass, no skin or nipple findings, nothing left axilla, no swelling LUE.  Right breast without mass or other findings of concern, right axilla and RUE unremarkable. Skin without rash or ecchymosis  Lab Results:  Results for orders placed in visit on 05/06/12  CBC WITH DIFFERENTIAL      Component Value Range   WBC 4.5  3.9 - 10.3 10e3/uL   NEUT# 2.6  1.5 - 6.5 10e3/uL   HGB 13.6  11.6 - 15.9 g/dL   HCT 16.1  09.6 - 04.5 %   Platelets 210  145 - 400 10e3/uL   MCV 86.5  79.5 - 101.0 fL   MCH 29.5  25.1 - 34.0 pg   MCHC 34.1  31.5 - 36.0 g/dL   RBC 4.09  8.11 - 9.14 10e6/uL   RDW 14.4  11.2 - 14.5 %   lymph# 1.4  0.9 - 3.3 10e3/uL   MONO# 0.3  0.1 - 0.9 10e3/uL   Eosinophils Absolute 0.1  0.0 - 0.5 10e3/uL   Basophils Absolute 0.0  0.0 - 0.1 10e3/uL   NEUT% 57.5  38.4 - 76.8 %   LYMPH% 31.6  14.0 - 49.7 %   MONO% 7.5  0.0 - 14.0 %   EOS% 2.4  0.0 - 7.0 %   BASO% 1.0  0.0 - 2.0 %  COMPREHENSIVE METABOLIC PANEL (CC13)  Component Value Range   Sodium 141  136 - 145 mEq/L   Potassium 4.1  3.5 - 5.1 mEq/L   Chloride 109 (*) 98 - 107 mEq/L   CO2 26  22 - 29 mEq/L   Glucose 96  70 - 99 mg/dl   BUN 16.1  7.0 - 09.6 mg/dL   Creatinine 0.8  0.6 - 1.1 mg/dL   Total Bilirubin 0.45  0.20 - 1.20 mg/dL   Alkaline Phosphatase 85  40 - 150 U/L   AST 12  5 - 34 U/L   ALT 9  0 - 55 U/L   Total Protein 7.1  6.4 - 8.3 g/dL   Albumin 3.5  3.5 - 5.0 g/dL   Calcium 9.6  8.4 - 40.9 mg/dL     Studies/Results: We have discussed 3D/tomo mammography, which is particularly helpful with dense breast tissue; I have requested this be done for her annual mammography in April. Breast  MRI within 3 months of the mammograms also seems reasonable and patient is in agreement with this.  Medications: I have reviewed the patient's current medications. She is off cholesterol medication and norvasc, hopes to improve enough with weight loss not to need these again. She is given flu vaccine today.  Assessment/Plan:  1.T1N1 left breast cancer diagnosed 2003, treatment as above and on observation. 3D/tomo mammograms April and breast MRI to follow. 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

## 2012-05-06 NOTE — Patient Instructions (Addendum)
3D tomo mammograms in April.   MRI within 3 months after mammograms  Flu vaccine done today  Hooray for the weight loss!

## 2012-05-15 ENCOUNTER — Telehealth: Payer: Self-pay | Admitting: Oncology

## 2012-05-15 NOTE — Telephone Encounter (Signed)
lmonvm for pt re appt for lb/LL 05/08/13 and mammo 08/11/12 @ [facility]. Pt aware the [facility] would contact her re appt for breast mri in May. lmonvm for [facility] @ [facility] requesting breast mri appt May 2014. Lynden Ang will contact pt re appt. Sent [facility] referral form to desk nurse to complete for breast mri appt. Schedule for Mammo and 25yr lb/fu mailed.

## 2012-05-16 ENCOUNTER — Telehealth: Payer: Self-pay | Admitting: Oncology

## 2012-05-16 NOTE — Telephone Encounter (Signed)
Completed [facility] referral form for breast mri returned by desk nurse today and faxed to [facility]. Lynden Ang will contact pt w/appt.

## 2012-08-11 ENCOUNTER — Ambulatory Visit
Admission: RE | Admit: 2012-08-11 | Discharge: 2012-08-11 | Disposition: A | Discharge status: Home / Self Care | Payer: 59 | Source: Ambulatory Visit | Attending: Oncology | Admitting: Oncology

## 2012-08-11 DIAGNOSIS — Z1231 Encounter for screening mammogram for malignant neoplasm of breast: Secondary | ICD-10-CM

## 2012-08-19 ENCOUNTER — Ambulatory Visit
Admission: RE | Admit: 2012-08-19 | Discharge: 2012-08-19 | Disposition: A | Discharge status: Home / Self Care | Payer: 59 | Source: Ambulatory Visit | Attending: Oncology | Admitting: Oncology

## 2012-08-19 DIAGNOSIS — Z853 Personal history of malignant neoplasm of breast: Secondary | ICD-10-CM

## 2012-08-19 MED ORDER — GADOBENATE DIMEGLUMINE 529 MG/ML IV SOLN
18.0000 mL | Freq: Once | INTRAVENOUS | Status: AC | PRN
  Administered 2012-08-19: 18 mL via INTRAVENOUS

## 2012-08-21 ENCOUNTER — Telehealth: Payer: Self-pay | Admitting: *Deleted

## 2012-08-21 NOTE — Telephone Encounter (Signed)
Pt notified of result below. Was very pleased

## 2012-08-21 NOTE — Telephone Encounter (Signed)
Message copied by Carola Rhine A on Thu Aug 21, 2012 10:07 AM ------      Message from: Reece Packer      Created: Tue Aug 19, 2012  7:06 PM       Labs seen and need follow up: please let her know breast MRI showed nothing of concern      Cc LA, TH ------

## 2012-09-09 ENCOUNTER — Encounter: Payer: Self-pay | Admitting: Gynecology

## 2012-09-09 ENCOUNTER — Ambulatory Visit (INDEPENDENT_AMBULATORY_CARE_PROVIDER_SITE_OTHER): Payer: 59 | Admitting: Gynecology

## 2012-09-09 VITALS — BP 124/78 | Ht 67.0 in | Wt 190.0 lb

## 2012-09-09 DIAGNOSIS — Z01419 Encounter for gynecological examination (general) (routine) without abnormal findings: Secondary | ICD-10-CM

## 2012-09-09 NOTE — Progress Notes (Signed)
[name] 1960/07/12 [id number]        52 y.o.  G0P0 new patient for annual exam.  Former patient of Dr. Nicholas Lose. Several issues that are below.  Past medical history,surgical history, medications, allergies, family history and social history were all reviewed and documented in the EPIC chart. ROS:  Was performed and pertinent positives and negatives are included in the history.  Exam: [name] assistant Filed Vitals:   09/09/12 1116  BP: 124/78  Height: 5\' 7"  (1.702 m)  Weight: 190 lb (86.183 kg)   General appearance  Normal Skin grossly normal Head/Neck normal with no cervical or supraclavicular adenopathy thyroid normal Lungs  clear Cardiac RR, without RMG Abdominal  soft, nontender, without masses, organomegaly or hernia Breasts  examined lying and sitting without masses, retractions, discharge or axillary adenopathy. Well-healed left lumpectomy scar Pelvic  Ext/BUS/vagina  normal   Cervical stump  normal    Adnexa  Without masses or tenderness    Anus and perineum  normal   Rectovaginal  normal sphincter tone without palpated masses or tenderness.    Assessment/Plan:  52 y.o. G0P0 female for annual exam.   1. History of supracervical hysterectomy BSO for recurrent endocervical polyps by Dr. Nicholas Lose. Doing well without significant menopausal symptoms such as hot flashes night sweats vaginal dryness or irritation. 2. Estring. Patient using Estring and apparently was started prophylactically after her hysterectomy without symptoms. I reviewed the pros and cons of using prophylactically versus waiting to see if she would develop symptoms. She has a history of estrogen receptor positive breast cancer. The possible issue of absorption of estrogen with Estring discussed. Dr. Darrold Span is comfortable with her being on the Estring. After a lengthy discussion as the patient never really had symptoms she preferred just to stop the Estring and see how she does. She will follow up with me if she has  significant vaginal symptoms. 3. Breast cancer. Mammogram 07/2012. Continue to followup with Dr. Darrold Span. 4. Pap smear 2013. No Pap smear done today. No history of abnormal Pap smears previously. She does have a cervical stump and need to continue screening every 3 years reviewed. 5. Colonoscopy 2013. Continue with their recommended interval. 6. DEXA never. Plan baseline closer to 60. 7. Health maintenance. No lab work done as it is all done through our other physicians office. Followup one year, sooner as needed.    [name] MD, 11:51 AM 09/09/2012

## 2012-09-09 NOTE — Patient Instructions (Signed)
Follow up in one year for annual exam 

## 2012-10-01 ENCOUNTER — Ambulatory Visit: Payer: 59 | Admitting: Internal Medicine

## 2013-03-07 ENCOUNTER — Inpatient Hospital Stay (HOSPITAL_COMMUNITY)
Admission: EM | Admit: 2013-03-07 | Discharge: 2013-03-16 | DRG: 331 | Disposition: A | Discharge status: Home / Self Care | Payer: 59 | Attending: General Surgery | Admitting: General Surgery

## 2013-03-07 ENCOUNTER — Emergency Department (HOSPITAL_COMMUNITY): Payer: 59

## 2013-03-07 ENCOUNTER — Encounter (HOSPITAL_COMMUNITY): Payer: Self-pay | Admitting: Emergency Medicine

## 2013-03-07 DIAGNOSIS — R933 Abnormal findings on diagnostic imaging of other parts of digestive tract: Secondary | ICD-10-CM

## 2013-03-07 DIAGNOSIS — Z79899 Other long term (current) drug therapy: Secondary | ICD-10-CM

## 2013-03-07 DIAGNOSIS — Z853 Personal history of malignant neoplasm of breast: Secondary | ICD-10-CM

## 2013-03-07 DIAGNOSIS — Z170 Estrogen receptor positive status [ER+]: Secondary | ICD-10-CM

## 2013-03-07 DIAGNOSIS — Z9221 Personal history of antineoplastic chemotherapy: Secondary | ICD-10-CM

## 2013-03-07 DIAGNOSIS — R109 Unspecified abdominal pain: Secondary | ICD-10-CM

## 2013-03-07 DIAGNOSIS — R1031 Right lower quadrant pain: Secondary | ICD-10-CM

## 2013-03-07 DIAGNOSIS — I252 Old myocardial infarction: Secondary | ICD-10-CM

## 2013-03-07 DIAGNOSIS — Z823 Family history of stroke: Secondary | ICD-10-CM

## 2013-03-07 DIAGNOSIS — Z880 Allergy status to penicillin: Secondary | ICD-10-CM

## 2013-03-07 DIAGNOSIS — C181 Malignant neoplasm of appendix: Principal | ICD-10-CM | POA: Diagnosis present

## 2013-03-07 DIAGNOSIS — Z923 Personal history of irradiation: Secondary | ICD-10-CM

## 2013-03-07 DIAGNOSIS — I10 Essential (primary) hypertension: Secondary | ICD-10-CM | POA: Diagnosis present

## 2013-03-07 DIAGNOSIS — R1110 Vomiting, unspecified: Secondary | ICD-10-CM

## 2013-03-07 DIAGNOSIS — Z8249 Family history of ischemic heart disease and other diseases of the circulatory system: Secondary | ICD-10-CM

## 2013-03-07 DIAGNOSIS — Z803 Family history of malignant neoplasm of breast: Secondary | ICD-10-CM

## 2013-03-07 DIAGNOSIS — E785 Hyperlipidemia, unspecified: Secondary | ICD-10-CM | POA: Diagnosis present

## 2013-03-07 DIAGNOSIS — J309 Allergic rhinitis, unspecified: Secondary | ICD-10-CM | POA: Diagnosis present

## 2013-03-07 HISTORY — DX: Malignant neoplasm of appendix: C18.1

## 2013-03-07 LAB — COMPREHENSIVE METABOLIC PANEL
ALT: 8 U/L (ref 0–35)
AST: 13 U/L (ref 0–37)
Alkaline Phosphatase: 89 U/L (ref 39–117)
CO2: 25 mEq/L (ref 19–32)
GFR calc Af Amer: >90 mL/min (ref >90)
GFR calc non Af Amer: >90 mL/min (ref >90)
Glucose, Bld: 111 mg/dL — ABNORMAL HIGH (ref 70–99)
Potassium: 3.5 mEq/L (ref 3.5–5.1)
Sodium: 136 mEq/L (ref 135–145)
Total Protein: 7.3 g/dL (ref 6.0–8.3)

## 2013-03-07 LAB — URINE MICROSCOPIC-ADD ON

## 2013-03-07 LAB — CBC WITH DIFFERENTIAL/PLATELET
Basophils Absolute: 0 10*3/uL (ref 0.0–0.1)
Lymphocytes Relative: 15 % (ref 12–46)
Lymphs Abs: 1.3 10*3/uL (ref 0.7–4.0)
Neutrophils Relative %: 79 % — ABNORMAL HIGH (ref 43–77)
Platelets: 240 10*3/uL (ref 150–400)
RBC: 4.84 MIL/uL (ref 3.87–5.11)
WBC: 8.7 10*3/uL (ref 4.0–10.5)

## 2013-03-07 LAB — URINALYSIS, ROUTINE W REFLEX MICROSCOPIC
Bilirubin Urine: NEGATIVE
Glucose, UA: NEGATIVE mg/dL
Specific Gravity, Urine: 1.007 (ref 1.005–1.030)
Urobilinogen, UA: 0.2 mg/dL (ref 0.0–1.0)

## 2013-03-07 MED ORDER — MORPHINE SULFATE 4 MG/ML IJ SOLN: 4.0000 mg | Freq: Once | INTRAMUSCULAR | Status: DC

## 2013-03-07 MED ORDER — SODIUM CHLORIDE 0.9 % IV BOLUS (SEPSIS)
1000.0000 mL | Freq: Once | INTRAVENOUS | Status: AC
  Administered 2013-03-07: 1000 mL via INTRAVENOUS

## 2013-03-07 MED ORDER — IOHEXOL 300 MG/ML  SOLN
100.0000 mL | Freq: Once | INTRAMUSCULAR | Status: AC | PRN
  Administered 2013-03-07: 100 mL via INTRAVENOUS

## 2013-03-07 MED ORDER — ONDANSETRON HCL 4 MG/2ML IJ SOLN: 4.0000 mg | Freq: Once | INTRAMUSCULAR | Status: DC

## 2013-03-07 NOTE — ED Notes (Signed)
Pt. completed drinking her oral contrast , CT tech notified.
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
## 2013-03-07 NOTE — ED Notes (Signed)
Patient transported to CT 

## 2013-03-07 NOTE — ED Notes (Signed)
Pt c/o pain to abdomen in umbilical area. Pt states she began having the pain around 12am, nausea and dry heaves. Pt states she thought it may have been food poisoning but has not had any diarrhea or vomiting. Pt describes the pain as cramping sensation. Pt states she has nagging rt groin pain that has been there for 2mos. Pt states the cramping comes and goes.

## 2013-03-07 NOTE — H&P (Signed)
[name] is an 52 y.o. female.   Chief Complaint: Right lower quadrant abdominal pain HPI: This is a 52 year old female with a history of intermittent right lower quadrant abdominal pain which started around August of this year. It is intermittent and sharp. She had a CAT scan in September with abnormal findings in the right lower quadrant. She had seen a gastroenterologist who has been treating her. She had a negative colonoscopy around a year prior to this. She has had some mild constipation. She had one episode of nausea and vomiting. The pain is mild to moderate in intensity. She has no fevers or chills.  Past Medical History  Diagnosis Date  . Breast cancer     left breast ca, lumpectomy, chemotherapy, radiation.  2003  . Allergic rhinitis   . Non-STEMI (non-ST elevated myocardial infarction)     June 23, 2010, presumed vasospasm in the absence CAD at catheter    Past Surgical History  Procedure Laterality Date  . Oophorectomy      BSO  . Total abdominal hysterectomy  2008    Supracervical with BSO  . Breast lumpectomy  2003    Left  . Breast biopsy  1996    right    Family History  Problem Relation Age of Onset  . Cancer Mother 62    breast  . Diabetes Mother   . Arthritis Mother   . Clotting disorder Mother   . Cancer Paternal Aunt 37    breast  . Stroke Brother   . Heart attack Brother    Social History:  reports that she has never smoked. She does not have any smokeless tobacco history on file. She reports that she does not drink alcohol or use illicit drugs.  Allergies:  Allergies  Allergen Reactions  . Penicillins     REACTION: swelling     (Not in a hospital admission)  Results for orders placed during the hospital encounter of 03/07/13 (from the past 48 hour(s))  CBC WITH DIFFERENTIAL     Status: Abnormal   Collection Time    03/07/13  7:10 PM      Result Value Range   WBC 8.7  4.0 - 10.5 K/uL   RBC 4.84  3.87 - 5.11 MIL/uL   Hemoglobin  13.5  12.0 - 15.0 g/dL   HCT 16.1  09.6 - 04.5 %   MCV 82.2  78.0 - 100.0 fL   MCH 27.9  26.0 - 34.0 pg   MCHC 33.9  30.0 - 36.0 g/dL   RDW 40.9  81.1 - 91.4 %   Platelets 240  150 - 400 K/uL   Neutrophils Relative % 79 (*) 43 - 77 %   Neutro Abs 6.8  1.7 - 7.7 K/uL   Lymphocytes Relative 15  12 - 46 %   Lymphs Abs 1.3  0.7 - 4.0 K/uL   Monocytes Relative 6  3 - 12 %   Monocytes Absolute 0.5  0.1 - 1.0 K/uL   Eosinophils Relative 0  0 - 5 %   Eosinophils Absolute 0.0  0.0 - 0.7 K/uL   Basophils Relative 0  0 - 1 %   Basophils Absolute 0.0  0.0 - 0.1 K/uL  COMPREHENSIVE METABOLIC PANEL     Status: Abnormal   Collection Time    03/07/13  7:10 PM      Result Value Range   Sodium 136  135 - 145 mEq/L   Potassium 3.5  3.5 - 5.1  mEq/L   Chloride 100  96 - 112 mEq/L   CO2 25  19 - 32 mEq/L   Glucose, Bld 111 (*) 70 - 99 mg/dL   BUN 9  6 - 23 mg/dL   Creatinine, Ser 9.56  0.50 - 1.10 mg/dL   Calcium 9.5  8.4 - 21.3 mg/dL   Total Protein 7.3  6.0 - 8.3 g/dL   Albumin 3.7  3.5 - 5.2 g/dL   AST 13  0 - 37 U/L   ALT 8  0 - 35 U/L   Alkaline Phosphatase 89  39 - 117 U/L   Total Bilirubin 0.4  0.3 - 1.2 mg/dL   GFR calc non Af Amer >90  >90 mL/min   GFR calc Af Amer >90  >90 mL/min   Comment: (NOTE)     The eGFR has been calculated using the CKD EPI equation.     This calculation has not been validated in all clinical situations.     eGFR's persistently <90 mL/min signify possible Chronic Kidney     Disease.  LIPASE, BLOOD     Status: None   Collection Time    03/07/13  7:10 PM      Result Value Range   Lipase 33  11 - 59 U/L  URINALYSIS, ROUTINE W REFLEX MICROSCOPIC     Status: Abnormal   Collection Time    03/07/13  7:46 PM      Result Value Range   Color, Urine YELLOW  YELLOW   APPearance CLEAR  CLEAR   Specific Gravity, Urine 1.007  1.005 - 1.030   pH 6.0  5.0 - 8.0   Glucose, UA NEGATIVE  NEGATIVE mg/dL   Hgb urine dipstick MODERATE (*) NEGATIVE   Bilirubin Urine  NEGATIVE  NEGATIVE   Ketones, ur 15 (*) NEGATIVE mg/dL   Protein, ur NEGATIVE  NEGATIVE mg/dL   Urobilinogen, UA 0.2  0.0 - 1.0 mg/dL   Nitrite NEGATIVE  NEGATIVE   Leukocytes, UA NEGATIVE  NEGATIVE  URINE MICROSCOPIC-ADD ON     Status: None   Collection Time    03/07/13  7:46 PM      Result Value Range   Squamous Epithelial / LPF RARE  RARE   WBC, UA 0-2  <3 WBC/hpf   RBC / HPF 3-6  <3 RBC/hpf   Bacteria, UA RARE  RARE   Ct Abdomen Pelvis W Contrast  03/07/2013   CLINICAL DATA:  Right lower quadrant pain. Umbilical pain  EXAM: CT ABDOMEN AND PELVIS WITH CONTRAST  TECHNIQUE: Multidetector CT imaging of the abdomen and pelvis was performed using the standard protocol following bolus administration of intravenous contrast.  CONTRAST:  OMNIPAQUE IOHEXOL 300 MG/ML  SOLN  COMPARISON:  01/09/2013  FINDINGS: There is no pleural effusion identified. No focal liver abnormality.  There is a small amount of perihepatic ascites which is new from previous exam. Gallbladder appears normal. No biliary dilatation. Normal appearance of the pancreas. There is a low attenuation structure within the spleen measuring 1.2 cm, image 21/ series 2. This is unchanged from previous exam.  The adrenal glands are both normal. Several tiny cysts are identified in both kidneys. The urinary bladder appears normal. Previous hysterectomy.  Normal caliber of the abdominal aorta. There is no upper stress set there are no pathologically enlarged upper abdominal lymph nodes. No pelvic or inguinal adenopathy.  The stomach appears normal. Within the right lower quadrant of the abdomen there is a blind-ending tubular structure  which measures 3 cm in diameter. There is surrounding inflammation and free fluid as seen previously. When compared with the previous exam there has been increase in the surrounding right lower quadrant fluid and there is now abnormal inflammation involving the terminal ileum. The wall of the terminal ileum  measures up to 6 mm in maximum thickness. Increase in abdominal and pelvic ascites. The colon is within normal limits.  IMPRESSION: 1. Interval progression of abnormal inflammation within the right lower quadrant. This appears to originate from a blind ending tubular structure which may reflect chronic appendicitis or inflamed appendiceal mucocele.  2. Interval development of secondary terminal ileitis. No evidence for bowel obstruction or perforation.  3. Abdominal and pelvic free fluid. New from previous exam.   Electronically Signed   By: [name] M.D.   On: 03/07/2013 21:49    Review of Systems  All other systems reviewed and are negative.    Blood pressure 135/89, pulse 84, temperature 98.5 F (36.9 C), temperature source Oral, resp. rate 14, SpO2 99.00%. Physical Exam  Constitutional: She is oriented to person, place, and time. She appears well-developed and well-nourished. No distress.  HENT:  Head: Normocephalic and atraumatic.  Right Ear: External ear normal.  Left Ear: External ear normal.  Nose: Nose normal.  Mouth/Throat: Oropharynx is clear and moist. No oropharyngeal exudate.  Eyes: Conjunctivae are normal. Pupils are equal, round, and reactive to light. Right eye exhibits no discharge. Left eye exhibits no discharge. No scleral icterus.  Neck: Normal range of motion. Neck supple. No tracheal deviation present.  Cardiovascular: Normal rate, regular rhythm, normal heart sounds and intact distal pulses.   No murmur heard. Respiratory: Effort normal and breath sounds normal. No respiratory distress. She has no wheezes.  GI: Soft. Bowel sounds are normal. She exhibits no distension and no mass.  There is very mild right lower quadrant tenderness with minimal guarding  Musculoskeletal: Normal range of motion. She exhibits no edema and no tenderness.  Lymphadenopathy:    She has no cervical adenopathy.  Neurological: She is alert and oriented to person, place, and time.   Skin: Skin is warm and dry. No rash noted. She is not diaphoretic. No erythema.  Psychiatric: Her behavior is normal. Judgment normal.     Assessment/Plan Right lower quadrant abdominal pain and CAT scan findings which are abnormal.  This appears to be a chronic process. I cannot rule out malignancy. I do not believe she has acute appendicitis. I believe she needs to be admitted to the hospital for IV rehydration and further workup including tumor markers. She may at least need an eventual diagnostic laparoscopy and/or exploratory laparotomy. She would need a bowel preparation first. She even may need to have gastroenterology see her before this to consider a colonoscopy.  [name] A 03/07/2013, 11:31 PM
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
## 2013-03-08 ENCOUNTER — Encounter (HOSPITAL_COMMUNITY): Payer: Self-pay | Admitting: *Deleted

## 2013-03-08 DIAGNOSIS — R1031 Right lower quadrant pain: Secondary | ICD-10-CM

## 2013-03-08 DIAGNOSIS — R109 Unspecified abdominal pain: Secondary | ICD-10-CM

## 2013-03-08 DIAGNOSIS — R933 Abnormal findings on diagnostic imaging of other parts of digestive tract: Secondary | ICD-10-CM

## 2013-03-08 LAB — CEA: CEA: 3.9 ng/mL (ref 0.0–5.0)

## 2013-03-08 LAB — CA 125: CA 125: 2.9 U/mL (ref 0.0–30.2)

## 2013-03-08 LAB — CANCER ANTIGEN 19-9: CA 19-9: 182.4 U/mL — ABNORMAL HIGH (ref <35.0)

## 2013-03-08 MED ORDER — MORPHINE SULFATE 4 MG/ML IJ SOLN
4.0000 mg | INTRAMUSCULAR | Status: DC | PRN
Start: 2013-03-08 — End: 2013-03-16
  Administered 2013-03-10 – 2013-03-11 (×3): 4 mg via INTRAVENOUS
  Filled 2013-03-08 (×3): qty 1

## 2013-03-08 MED ORDER — ACETAMINOPHEN 325 MG PO TABS: 650.0000 mg | ORAL_TABLET | Freq: Four times a day (QID) | ORAL | Status: DC | PRN

## 2013-03-08 MED ORDER — IBUPROFEN 600 MG PO TABS
600.0000 mg | ORAL_TABLET | Freq: Three times a day (TID) | ORAL | Status: DC | PRN
  Administered 2013-03-08 – 2013-03-10 (×3): 600 mg via ORAL
  Filled 2013-03-08 (×3): qty 1

## 2013-03-08 MED ORDER — PEG-KCL-NACL-NASULF-NA ASC-C 100 G PO SOLR
1.0000 | Freq: Once | ORAL | Status: DC
  Filled 2013-03-08: qty 1

## 2013-03-08 MED ORDER — PEG-KCL-NACL-NASULF-NA ASC-C 100 G PO SOLR
0.5000 | Freq: Once | ORAL | Status: AC
  Administered 2013-03-08: 100 g via ORAL

## 2013-03-08 MED ORDER — PANTOPRAZOLE SODIUM 40 MG PO TBEC
40.0000 mg | DELAYED_RELEASE_TABLET | Freq: Every day | ORAL | Status: DC
  Administered 2013-03-08 – 2013-03-16 (×9): 40 mg via ORAL
  Filled 2013-03-08 (×8): qty 1

## 2013-03-08 MED ORDER — ENOXAPARIN SODIUM 40 MG/0.4ML ~~LOC~~ SOLN
40.0000 mg | SUBCUTANEOUS | Status: DC
  Administered 2013-03-08: 40 mg via SUBCUTANEOUS
  Filled 2013-03-08 (×2): qty 0.4

## 2013-03-08 MED ORDER — HYDROCODONE-ACETAMINOPHEN 5-325 MG PO TABS
1.0000 | ORAL_TABLET | ORAL | Status: DC | PRN
  Administered 2013-03-08: 2 via ORAL
  Administered 2013-03-08: 1 via ORAL
  Administered 2013-03-10 – 2013-03-11 (×2): 2 via ORAL
  Filled 2013-03-08 (×4): qty 2
  Filled 2013-03-08: qty 1

## 2013-03-08 MED ORDER — PEG-KCL-NACL-NASULF-NA ASC-C 100 G PO SOLR
0.5000 | Freq: Once | ORAL | Status: AC
  Administered 2013-03-09: 100 g via ORAL

## 2013-03-08 MED ORDER — METRONIDAZOLE IN NACL 5-0.79 MG/ML-% IV SOLN
500.0000 mg | Freq: Three times a day (TID) | INTRAVENOUS | Status: DC
  Administered 2013-03-08 – 2013-03-10 (×8): 500 mg via INTRAVENOUS
  Administered 2013-03-10: .5 g via INTRAVENOUS
  Administered 2013-03-10 – 2013-03-11 (×2): 500 mg via INTRAVENOUS
  Filled 2013-03-08 (×14): qty 100

## 2013-03-08 MED ORDER — ONDANSETRON HCL 4 MG/2ML IJ SOLN
4.0000 mg | Freq: Four times a day (QID) | INTRAMUSCULAR | Status: DC | PRN
  Administered 2013-03-08 – 2013-03-11 (×3): 4 mg via INTRAVENOUS
  Filled 2013-03-08 (×2): qty 2

## 2013-03-08 MED ORDER — ACETAMINOPHEN 650 MG RE SUPP: 650.0000 mg | Freq: Four times a day (QID) | RECTAL | Status: DC | PRN

## 2013-03-08 MED ORDER — CIPROFLOXACIN IN D5W 400 MG/200ML IV SOLN
400.0000 mg | Freq: Two times a day (BID) | INTRAVENOUS | Status: DC
  Administered 2013-03-08 – 2013-03-10 (×7): 400 mg via INTRAVENOUS
  Filled 2013-03-08 (×9): qty 200

## 2013-03-08 MED ORDER — POTASSIUM CHLORIDE IN NACL 20-0.9 MEQ/L-% IV SOLN
INTRAVENOUS | Status: DC
  Administered 2013-03-08 – 2013-03-12 (×8): via INTRAVENOUS
  Filled 2013-03-08 (×13): qty 1000

## 2013-03-08 NOTE — Progress Notes (Signed)
Subjective: C/o HA more than abdominal pain, +nausea  Objective: Vital signs in last 24 hours: Temp:  [97.2 F (36.2 C)-98.7 F (37.1 C)] 97.2 F (36.2 C) (11/09 0447) Pulse Rate:  [75-93] 78 (11/09 0447) Resp:  [14-20] 16 (11/09 0447) BP: (118-164)/(70-101) 118/70 mmHg (11/09 0447) SpO2:  [93 %-100 %] 99 % (11/09 0447) Weight:  [201 lb 1 oz (91.2 kg)] 201 lb 1 oz (91.2 kg) (11/09 0052) Last BM Date: 03/07/13  Intake/Output from previous day: 11/08 0701 - 11/09 0700 In: 1000 [I.V.:1000] Out: -  Intake/Output this shift: Total I/O In: 240 [P.O.:240] Out: -   General appearance: alert, cooperative and no distress Resp: clear to auscultation bilaterally Cardio: normal rate, regular GI: sitting up by the window, abdomen is soft, ND, she has mild RLQ pain, without peritoneal signs  Lab Results:   Recent Labs  03/07/13 1910  WBC 8.7  HGB 13.5  HCT 39.8  PLT 240   BMET  Recent Labs  03/07/13 1910  NA 136  K 3.5  CL 100  CO2 25  GLUCOSE 111*  BUN 9  CREATININE 0.69  CALCIUM 9.5   PT/INR No results found for this basename: LABPROT, INR,  in the last 72 hours ABG No results found for this basename: PHART, PCO2, PO2, HCO3,  in the last 72 hours  Studies/Results: Ct Abdomen Pelvis W Contrast  03/07/2013   CLINICAL DATA:  Right lower quadrant pain. Umbilical pain  EXAM: CT ABDOMEN AND PELVIS WITH CONTRAST  TECHNIQUE: Multidetector CT imaging of the abdomen and pelvis was performed using the standard protocol following bolus administration of intravenous contrast.  CONTRAST:  OMNIPAQUE IOHEXOL 300 MG/ML  SOLN  COMPARISON:  01/09/2013  FINDINGS: There is no pleural effusion identified. No focal liver abnormality.  There is a small amount of perihepatic ascites which is new from previous exam. Gallbladder appears normal. No biliary dilatation. Normal appearance of the pancreas. There is a low attenuation structure within the spleen measuring 1.2 cm, image 21/  series 2. This is unchanged from previous exam.  The adrenal glands are both normal. Several tiny cysts are identified in both kidneys. The urinary bladder appears normal. Previous hysterectomy.  Normal caliber of the abdominal aorta. There is no upper stress set there are no pathologically enlarged upper abdominal lymph nodes. No pelvic or inguinal adenopathy.  The stomach appears normal. Within the right lower quadrant of the abdomen there is a blind-ending tubular structure which measures 3 cm in diameter. There is surrounding inflammation and free fluid as seen previously. When compared with the previous exam there has been increase in the surrounding right lower quadrant fluid and there is now abnormal inflammation involving the terminal ileum. The wall of the terminal ileum measures up to 6 mm in maximum thickness. Increase in abdominal and pelvic ascites. The colon is within normal limits.  IMPRESSION: 1. Interval progression of abnormal inflammation within the right lower quadrant. This appears to originate from a blind ending tubular structure which may reflect chronic appendicitis or inflamed appendiceal mucocele.  2. Interval development of secondary terminal ileitis. No evidence for bowel obstruction or perforation.  3. Abdominal and pelvic free fluid. New from previous exam.   Electronically Signed   By: [name] M.D.   On: 03/07/2013 21:49    Anti-infectives: Anti-infectives   Start     Dose/Rate Route Frequency Ordered Stop   03/08/13 0115  ciprofloxacin (CIPRO) IVPB 400 mg     400 mg 200  mL/hr over 60 Minutes Intravenous Every 12 hours 03/08/13 0041     03/08/13 0115  metroNIDAZOLE (FLAGYL) IVPB 500 mg     500 mg 100 mL/hr over 60 Minutes Intravenous 3 times per day 03/08/13 0041        Assessment/Plan: s/p * No surgery found * Her symptoms are atypical for appendicitis and this does seem to be a chronic process.  Her exam shows some mild RLQ tenderness but no worse than  yesterday.  Remains AF.  I explained that I think that she will likely need surgical exploration and likely resection but I think that it would be appropriate to get GI consult.  I do not think that she needs emergent surgery right now although I did explain that this could be appendicitis in which surgery would be indicated asap.  Will ask for GI consult but she will likely need dx laparoscopy soon.  LOS: 1 day    [name] [name] 03/08/2013

## 2013-03-08 NOTE — Consult Note (Signed)
Patient seen, examined, and I agree with the above documentation, including the assessment and plan. Agree with colonoscopy tomorrow.  I have discussed this with the patient and her husband. Unclear etiology, but agree acute appendicitis is unlikely.  ? Of chronic appendicitis vs. IBD.  No family hx of IBD. The nature of the procedure, as well as the risks, benefits, and alternatives were carefully and thoroughly reviewed with the patient. Ample time for discussion and questions allowed. The patient understood, was satisfied, and agreed to proceed.  Moviprep tonight for likely colonoscopy tomorrow with Dr. Leone Payor

## 2013-03-08 NOTE — Consult Note (Signed)
Referring Provider: Dr. Biagio Quint Primary Care Physician:  Gaspar Garbe, MD Primary Gastroenterologist:  [facility]  Reason for Consultation:  Right lower abdominal pain, abnormal CT  HPI: [name] is a 52 y.o. female admitted yesterday after onset Friday evening with fairly intense lower midabdominal pain associated with nausea and vomiting which eventually became dry heaves. She says she had pain that lasted for about 17 hours total and then gradually improved after she was admitted. She had a couple of loose bowel movements at home but no frank diarrhea and no bleeding. She says generally she is constipated and had just been started on Linzess. about 2 weeks ago. She did not have any fever with this episode but did have chills. In retrospect she had been having some intermittent right lower quadrant discomfort since September of 2014. She says she would not describe this as a pain but rather a pulling or clubbing sensation that was more noticeable with lying flat stretching or sleeping on her abdomen. She says very occasionally she would get a sharp grabbing pain that was transient. She had seen Dr. Kinnie Scales in his office and he suggested that her pain may be secondary to constipation. She had had a CT scan of the abdomen and pelvis apparently done in September of 2014 [facility] and therefore was done without IV contrast. This apparently showed some"mild inflammation"per the patient. I do not have a copy of that report. Patient had undergone screening colonoscopy with Dr. Kinnie Scales in February 2013 and states that that was a normal exam. She says she had has had sharp intense abdominal pain and again this morning in the right lower quadrant, but no nausea or vomiting. No chills or fever -she is also having what she describes as a sinus headache today. Labs on admission were unremarkable including a normal WBC. CT of the abdomen and pelvis done 03/07/2013 shows interval progression of abnormal  inflammation in the right lower [place] and which appears to originate from a blind ending tubular structure which may reflect chronic appendicitis or inflamed appendiceal mucocele. There was also interval development of secondary terminal ileitis she also has a small amount of abdominal and pelvic free fluid maneuver prior exam no evidence of obstruction or perforation and no adenopathy.   Past Medical History  Diagnosis Date  . Breast cancer     left breast ca, lumpectomy, chemotherapy, radiation.  2003  . Allergic rhinitis   . Non-STEMI (non-ST elevated myocardial infarction)     June 23, 2010, presumed vasospasm in the absence CAD at catheter    Past Surgical History  Procedure Laterality Date  . Oophorectomy      BSO  . Total abdominal hysterectomy  2008    Supracervical with BSO  . Breast lumpectomy  2003    Left  . Breast biopsy  1996    right    Prior to Admission medications   Medication Sig Start Date End Date Taking? Authorizing Provider  Linaclotide (LINZESS) 145 MCG CAPS capsule Take 145 mcg by mouth daily.   Yes Historical Provider, MD    Current Facility-Administered Medications  Medication Dose Route Frequency Provider Last Rate Last Dose  . 0.9 % NaCl with KCl 20 mEq/ L  infusion   Intravenous Continuous Shelly Rubenstein, MD 125 mL/hr at 03/08/13 0156    . acetaminophen (TYLENOL) tablet 650 mg  650 mg Oral Q6H PRN Shelly Rubenstein, MD       Or  . acetaminophen (TYLENOL) suppository 650  mg  650 mg Rectal Q6H PRN Shelly Rubenstein, MD      . ciprofloxacin (CIPRO) IVPB 400 mg  400 mg Intravenous Q12H Shelly Rubenstein, MD   400 mg at 03/08/13 0910  . enoxaparin (LOVENOX) injection 40 mg  40 mg Subcutaneous Q24H Shelly Rubenstein, MD   40 mg at 03/08/13 0909  . HYDROcodone-acetaminophen (NORCO/VICODIN) 5-325 MG per tablet 1-2 tablet  1-2 tablet Oral Q4H PRN Shelly Rubenstein, MD   2 tablet at 03/08/13 0903  . ibuprofen (ADVIL,MOTRIN) tablet 600 mg  600 mg  Oral Q8H PRN Lodema Pilot, DO   600 mg at 03/08/13 1011  . metroNIDAZOLE (FLAGYL) IVPB 500 mg  500 mg Intravenous Q8H Shelly Rubenstein, MD   500 mg at 03/08/13 0622  . morphine 4 MG/ML injection 4 mg  4 mg Intravenous Q1H PRN Shelly Rubenstein, MD      . ondansetron [facility]) injection 4 mg  4 mg Intravenous Q6H PRN Shelly Rubenstein, MD   4 mg at 03/08/13 0903  . pantoprazole (PROTONIX) EC tablet 40 mg  40 mg Oral Daily Lodema Pilot, DO   40 mg at 03/08/13 1010    Allergies as of 03/07/2013 - Review Complete 03/07/2013  Allergen Reaction Noted  . Penicillins      Family History  Problem Relation Age of Onset  . Cancer Mother 16    breast  . Diabetes Mother   . Arthritis Mother   . Clotting disorder Mother   . Cancer Paternal Aunt 48    breast  . Stroke Brother   . Heart attack Brother     History   Social History  . Marital Status: Married    Spouse Name: N/A    Number of Children: 1  . Years of Education: N/A   Occupational History  . SUPERVISOR [employer]   Social History Main Topics  . Smoking status: Never Smoker   . Smokeless tobacco: Not on file  . Alcohol Use: No     Comment: 2 per month  . Drug Use: No  . Sexual Activity: Yes    Birth Control/ Protection: Surgical     Comment: HYST   Other Topics Concern  . Not on file   Social History Narrative  . No narrative on file    Review of Systems: Pertinent positive and negative review of systems were noted in the above HPI section.  All other review of systems was otherwise negative.Marland Kitchen  Physical Exam: Vital signs in last 24 hours: Temp:  [97.2 F (36.2 C)-98.7 F (37.1 C)] 98.3 F (36.8 C) (11/09 1007) Pulse Rate:  [71-93] 71 (11/09 1007) Resp:  [14-20] 20 (11/09 1007) BP: (118-164)/(70-101) 129/78 mmHg (11/09 1007) SpO2:  [93 %-100 %] 100 % (11/09 1007) Weight:  [201 lb 1 oz (91.2 kg)] 201 lb 1 oz (91.2 kg) (11/09 0052) Last BM Date: 03/07/13 General:   Alert,  Well-developed,WF,  well-nourished, pleasant and cooperative in NAD Head:  Normocephalic and atraumatic. Eyes:  Sclera clear, no icterus.   Conjunctiva pink. Ears:  Normal auditory acuity. Nose:  No deformity, discharge,  or lesions. Mouth:  No deformity or lesions.   Neck:  Supple; no masses or thyromegaly. Lungs:  Clear throughout to auscultation.   No wheezes, crackles, or rhonchi. Heart:  Regular rate and rhythm; no murmurs, clicks, rubs,  or gallops. Abdomen:  Soft,,tender RLQ , no guarding+ mild rebound tenderness , no palp mass or hsm Rectal:  Deferred  Msk:  Symmetrical without gross deformities. . Pulses:  Normal pulses noted. Extremities:  Without clubbing or edema. Neurologic:  Alert and  oriented x4;  grossly normal neurologically. Skin:  Intact without significant lesions or rashes.. Psych:  Alert and cooperative. Normal mood and affect.  Intake/Output from previous day: 11/08 0701 - 11/09 0700 In: 1000 [I.V.:1000] Out: -  Intake/Output this shift: Total I/O In: 240 [P.O.:240] Out: -   Lab Results:  Recent Labs  03/07/13 1910  WBC 8.7  HGB 13.5  HCT 39.8  PLT 240   BMET  Recent Labs  03/07/13 1910  NA 136  K 3.5  CL 100  CO2 25  GLUCOSE 111*  BUN 9  CREATININE 0.69  CALCIUM 9.5   LFT  Recent Labs  03/07/13 1910  PROT 7.3  ALBUMIN 3.7  AST 13  ALT 8  ALKPHOS 89  BILITOT 0.4   PT/INR No results found for this basename: LABPROT, INR,  in the last 72 hours Hepatitis Panel No results found for this basename: HEPBSAG, HCVAB, HEPAIGM, HEPBIGM,  in the last 72 hours    MPRESSION:  #31  52 year old female with acute/subacute right lower quadrant inflammatory process which appears to involve the appendix and terminal ileum. This apparently was present on CT in September of 2014 but has definitely progressed. Etiology is not clear, she may have a chronic appendicitis, or inflammatory bowel disease #2 normal colonoscopy February 2013/Dr. Medoff #3 remote history  of breast cancer 2003 #4 non-ST MI presumed secondary to vasospasm 2012 #5 chronic constipation #6 hypertension  PLAN: #1 serial CBCs #2 patient is being covered with empiric Cipro and Flagyl for now #3 discussed potential colonoscopy with patient and she is agreeable to proceed. Will prep later today with plans for colonoscopy tomorrow a.m. Dr. Leone Payor. #4 potential lap with appendectomy pending findings at colonoscopy   Debroh Sieloff  03/08/2013, 11:56 AM

## 2013-03-09 ENCOUNTER — Encounter (HOSPITAL_COMMUNITY): Payer: Self-pay | Admitting: *Deleted

## 2013-03-09 ENCOUNTER — Encounter (HOSPITAL_COMMUNITY): Admission: EM | Disposition: A | Discharge status: Home / Self Care | Payer: Self-pay | Source: Home / Self Care

## 2013-03-09 HISTORY — PX: COLONOSCOPY: SHX5424

## 2013-03-09 LAB — BASIC METABOLIC PANEL
BUN: 5 mg/dL — ABNORMAL LOW (ref 6–23)
CO2: 26 mEq/L (ref 19–32)
Calcium: 9.7 mg/dL (ref 8.4–10.5)
Chloride: 109 mEq/L (ref 96–112)
Creatinine, Ser: 0.68 mg/dL (ref 0.50–1.10)
GFR calc Af Amer: >90 mL/min (ref >90)
GFR calc non Af Amer: >90 mL/min (ref >90)
Glucose, Bld: 103 mg/dL — ABNORMAL HIGH (ref 70–99)

## 2013-03-09 LAB — CBC WITH DIFFERENTIAL/PLATELET
Basophils Absolute: 0 10*3/uL (ref 0.0–0.1)
Basophils Relative: 0 % (ref 0–1)
Eosinophils Absolute: 0.1 10*3/uL (ref 0.0–0.7)
Eosinophils Relative: 2 % (ref 0–5)
HCT: 37.5 % (ref 36.0–46.0)
Lymphs Abs: 1.7 10*3/uL (ref 0.7–4.0)
MCH: 27.7 pg (ref 26.0–34.0)
MCHC: 32.5 g/dL (ref 30.0–36.0)
Monocytes Absolute: 0.5 10*3/uL (ref 0.1–1.0)
Neutro Abs: 2.6 10*3/uL (ref 1.7–7.7)
Neutrophils Relative %: 53 % (ref 43–77)
RDW: 13.8 % (ref 11.5–15.5)
WBC: 4.8 10*3/uL (ref 4.0–10.5)

## 2013-03-09 SURGERY — COLONOSCOPY
Anesthesia: Moderate Sedation

## 2013-03-09 MED ORDER — FENTANYL CITRATE 0.05 MG/ML IJ SOLN
INTRAMUSCULAR | Status: AC
  Filled 2013-03-09: qty 2

## 2013-03-09 MED ORDER — MIDAZOLAM HCL 5 MG/5ML IJ SOLN
INTRAMUSCULAR | Status: DC | PRN
  Administered 2013-03-09: 1 mg via INTRAVENOUS
  Administered 2013-03-09: 2 mg via INTRAVENOUS
  Administered 2013-03-09: 1 mg via INTRAVENOUS
  Administered 2013-03-09: 2 mg via INTRAVENOUS
  Administered 2013-03-09: 1 mg via INTRAVENOUS
  Administered 2013-03-09: 2 mg via INTRAVENOUS
  Administered 2013-03-09 (×2): 1 mg via INTRAVENOUS

## 2013-03-09 MED ORDER — FENTANYL CITRATE 0.05 MG/ML IJ SOLN
INTRAMUSCULAR | Status: DC | PRN
  Administered 2013-03-09 (×4): 25 ug via INTRAVENOUS

## 2013-03-09 MED ORDER — MIDAZOLAM HCL 5 MG/ML IJ SOLN
INTRAMUSCULAR | Status: AC
  Filled 2013-03-09: qty 2

## 2013-03-09 NOTE — Progress Notes (Signed)
Films reviewed.  Looks inflammatory to me less likely neoplastic.  Await colonoscopy but plan tentatively to consider laparoscopy and possible laparotomy.

## 2013-03-09 NOTE — Op Note (Signed)
[facility] [address] [place], [address]   COLONOSCOPY PROCEDURE REPORT  PATIENT: [name], [name].  MR#: [id number] BIRTHDATE: 22-Nov-1960 , 52  yrs. old GENDER: Female ENDOSCOPIST: Iva Boop, MD, [facility] PROCEDURE DATE:  03/09/2013 PROCEDURE:   Colonoscopy, diagnostic First Screening Colonoscopy - Avg.  risk and is 50 yrs.  old or older - No.  Prior Negative Screening - Now for repeat screening. N/A  History of Adenoma - Now for follow-up colonoscopy & has been > or = to 3 yrs.  N/A ASA CLASS:   Class II INDICATIONS:an abnormal CT.   RLq/ileal mass MEDICATIONS: Fentanyl 100 mcg IV and Versed 7 mg IV  DESCRIPTION OF PROCEDURE:   After the risks benefits and alternatives of the procedure were thoroughly explained, informed consent was obtained.  A digital rectal exam revealed no abnormalities of the rectum.   The Pentax Adult Colon [id number] endoscope was introduced through the anus and advanced to the   . No adverse events experienced.   The quality of the prep was excellent, using MoviPrep  The instrument was then slowly withdrawn as the colon was fully examined.      COLON FINDINGS: A normal appearing cecum, ileocecal valve, and appendiceal orifice were identified.  The ascending, hepatic flexure, transverse, splenic flexure, descending, sigmoid colon and rectum appeared unremarkable.  No polyps or cancers were seen. Retroflexed views revealed no abnormalities. The time to cecum=6 minutes 0 seconds.  Withdrawal time=9 minutes 0 seconds.  The scope was withdrawn and the procedure completed. COMPLICATIONS: There were no complications.  ENDOSCOPIC IMPRESSION: Normal colonoscopy - could not enter terminal ileum  RECOMMENDATIONS: Per surgery   eSigned:  Iva Boop, MD, [facility] 03/09/2013 4:12 PM

## 2013-03-09 NOTE — Progress Notes (Signed)
Subjective: RLQ has improved.  C/o chills and sweats since she started bowel prep.  Denies n/v.    Objective: Vital signs in last 24 hours: Temp:  [97.5 F (36.4 C)-98.7 F (37.1 C)] 97.5 F (36.4 C) (11/10 0536) Pulse Rate:  [71-79] 73 (11/10 0536) Resp:  [18-20] 18 (11/10 0536) BP: (125-146)/(76-88) 129/88 mmHg (11/10 0536) SpO2:  [100 %] 100 % (11/10 0536) Last BM Date: 03/08/13  Intake/Output from previous day: 11/09 0701 - 11/10 0700 In: 5570 [P.O.:1200; I.V.:1750; IV Piggyback:700] Out: -  Intake/Output this shift:    General appearance: alert, cooperative, appears stated age and no distress Resp: clear to auscultation bilaterally Cardio: regular rate and rhythm, S1, S2 normal, no murmur, click, rub or gallop GI: +bs abdomen is soft round and tender to RLQ and pelvic region.  No masses or evidence of peritonitis.  Extremities: extremities normal, atraumatic, no cyanosis or edema  Lab Results:   Recent Labs  03/07/13 1910 03/09/13 0625  WBC 8.7 4.8  HGB 13.5 12.2  HCT 39.8 37.5  PLT 240 221   BMET  Recent Labs  03/07/13 1910 03/09/13 0625  NA 136 142  K 3.5 3.6  CL 100 109  CO2 25 26  GLUCOSE 111* 103*  BUN 9 5*  CREATININE 0.69 0.68  CALCIUM 9.5 9.7   PT/INR No results found for this basename: LABPROT, INR,  in the last 72 hours ABG No results found for this basename: PHART, PCO2, PO2, HCO3,  in the last 72 hours  Studies/Results: Ct Abdomen Pelvis W Contrast  03/07/2013   CLINICAL DATA:  Right lower quadrant pain. Umbilical pain  EXAM: CT ABDOMEN AND PELVIS WITH CONTRAST  TECHNIQUE: Multidetector CT imaging of the abdomen and pelvis was performed using the standard protocol following bolus administration of intravenous contrast.  CONTRAST:  OMNIPAQUE IOHEXOL 300 MG/ML  SOLN  COMPARISON:  01/09/2013  FINDINGS: There is no pleural effusion identified. No focal liver abnormality.  There is a small amount of perihepatic ascites which is new  from previous exam. Gallbladder appears normal. No biliary dilatation. Normal appearance of the pancreas. There is a low attenuation structure within the spleen measuring 1.2 cm, image 21/ series 2. This is unchanged from previous exam.  The adrenal glands are both normal. Several tiny cysts are identified in both kidneys. The urinary bladder appears normal. Previous hysterectomy.  Normal caliber of the abdominal aorta. There is no upper stress set there are no pathologically enlarged upper abdominal lymph nodes. No pelvic or inguinal adenopathy.  The stomach appears normal. Within the right lower quadrant of the abdomen there is a blind-ending tubular structure which measures 3 cm in diameter. There is surrounding inflammation and free fluid as seen previously. When compared with the previous exam there has been increase in the surrounding right lower quadrant fluid and there is now abnormal inflammation involving the terminal ileum. The wall of the terminal ileum measures up to 6 mm in maximum thickness. Increase in abdominal and pelvic ascites. The colon is within normal limits.  IMPRESSION: 1. Interval progression of abnormal inflammation within the right lower quadrant. This appears to originate from a blind ending tubular structure which may reflect chronic appendicitis or inflamed appendiceal mucocele.  2. Interval development of secondary terminal ileitis. No evidence for bowel obstruction or perforation.  3. Abdominal and pelvic free fluid. New from previous exam.   Electronically Signed   By: [name] M.D.   On: 03/07/2013 21:49  Anti-infectives: Anti-infectives   Start     Dose/Rate Route Frequency Ordered Stop   03/08/13 0115  ciprofloxacin (CIPRO) IVPB 400 mg     400 mg 200 mL/hr over 60 Minutes Intravenous Every 12 hours 03/08/13 0041     03/08/13 0115  metroNIDAZOLE (FLAGYL) IVPB 500 mg     500 mg 100 mL/hr over 60 Minutes Intravenous 3 times per day 03/08/13 0041         Assessment/Plan: 52 year old female hx of NSTEMI, breast CA and HTN  RLQ abdominal pain, inflammatory process involving the appendix and the terminal ileum -colonoscopy today.  Then we will discuss surgical exploration. -continue with atbx  -NPO, IV hydration -pain control -antiemetics   VTE prophylaxis -hold lovenox -SCDs, mobilize     LOS: 2 days    Sanyia Dini ANP-BC 03/09/2013 7:52 AM

## 2013-03-10 ENCOUNTER — Encounter (HOSPITAL_COMMUNITY): Admission: EM | Disposition: A | Discharge status: Home / Self Care | Payer: Self-pay | Source: Home / Self Care

## 2013-03-10 ENCOUNTER — Encounter (HOSPITAL_COMMUNITY): Payer: Self-pay | Admitting: Internal Medicine

## 2013-03-10 ENCOUNTER — Inpatient Hospital Stay (HOSPITAL_COMMUNITY): Payer: 59 | Admitting: Anesthesiology

## 2013-03-10 ENCOUNTER — Encounter (HOSPITAL_COMMUNITY): Payer: 59 | Admitting: Anesthesiology

## 2013-03-10 DIAGNOSIS — C181 Malignant neoplasm of appendix: Secondary | ICD-10-CM

## 2013-03-10 HISTORY — PX: LAPAROSCOPIC PARTIAL COLECTOMY: SHX5907

## 2013-03-10 HISTORY — DX: Malignant neoplasm of appendix: C18.1

## 2013-03-10 LAB — BASIC METABOLIC PANEL
BUN: 4 mg/dL — ABNORMAL LOW (ref 6–23)
CO2: 24 mEq/L (ref 19–32)
Calcium: 9.6 mg/dL (ref 8.4–10.5)
Chloride: 104 mEq/L (ref 96–112)
Creatinine, Ser: 0.64 mg/dL (ref 0.50–1.10)
Glucose, Bld: 99 mg/dL (ref 70–99)
Potassium: 4.1 mEq/L (ref 3.5–5.1)
Sodium: 138 mEq/L (ref 135–145)

## 2013-03-10 LAB — CBC
MCHC: 33.3 g/dL (ref 30.0–36.0)
MCV: 83.4 fL (ref 78.0–100.0)
Platelets: 209 10*3/uL (ref 150–400)
RDW: 13.6 % (ref 11.5–15.5)
WBC: 4.3 10*3/uL (ref 4.0–10.5)

## 2013-03-10 SURGERY — LAPAROSCOPIC PARTIAL COLECTOMY
Anesthesia: General | Site: Abdomen | Wound class: Clean Contaminated

## 2013-03-10 MED ORDER — VECURONIUM BROMIDE 10 MG IV SOLR
INTRAVENOUS | Status: DC | PRN
  Administered 2013-03-10 (×2): 2 mg via INTRAVENOUS
  Administered 2013-03-10: 1 mg via INTRAVENOUS

## 2013-03-10 MED ORDER — BUPIVACAINE-EPINEPHRINE (PF) 0.25% -1:200000 IJ SOLN
INTRAMUSCULAR | Status: DC | PRN
  Administered 2013-03-10: 5 mL

## 2013-03-10 MED ORDER — HYDROMORPHONE HCL PF 1 MG/ML IJ SOLN
INTRAMUSCULAR | Status: AC
  Administered 2013-03-10: 0.5 mg via INTRAVENOUS
  Filled 2013-03-10: qty 1

## 2013-03-10 MED ORDER — OXYCODONE HCL 5 MG/5ML PO SOLN
5.0000 mg | Freq: Once | ORAL | Status: AC | PRN
  Administered 2013-03-10: 5 mg via ORAL

## 2013-03-10 MED ORDER — BUPIVACAINE-EPINEPHRINE PF 0.25-1:200000 % IJ SOLN
INTRAMUSCULAR | Status: AC
  Filled 2013-03-10: qty 30

## 2013-03-10 MED ORDER — LIDOCAINE HCL (CARDIAC) 20 MG/ML IV SOLN
INTRAVENOUS | Status: DC | PRN
  Administered 2013-03-10: 40 mg via INTRAVENOUS

## 2013-03-10 MED ORDER — EPHEDRINE SULFATE 50 MG/ML IJ SOLN
INTRAMUSCULAR | Status: DC | PRN
  Administered 2013-03-10: 15 mg via INTRAVENOUS

## 2013-03-10 MED ORDER — HYDROMORPHONE HCL PF 1 MG/ML IJ SOLN
0.2500 mg | INTRAMUSCULAR | Status: DC | PRN
  Administered 2013-03-10: 0.5 mg via INTRAVENOUS

## 2013-03-10 MED ORDER — FENTANYL CITRATE 0.05 MG/ML IJ SOLN
INTRAMUSCULAR | Status: DC | PRN
  Administered 2013-03-10 (×4): 50 ug via INTRAVENOUS
  Administered 2013-03-10: 100 ug via INTRAVENOUS
  Administered 2013-03-10: 150 ug via INTRAVENOUS
  Administered 2013-03-10 (×2): 50 ug via INTRAVENOUS

## 2013-03-10 MED ORDER — ONDANSETRON HCL 4 MG/2ML IJ SOLN: 4.0000 mg | Freq: Four times a day (QID) | INTRAMUSCULAR | Status: DC | PRN

## 2013-03-10 MED ORDER — OXYCODONE HCL 5 MG/5ML PO SOLN
ORAL | Status: AC
  Filled 2013-03-10: qty 5

## 2013-03-10 MED ORDER — HYDROMORPHONE HCL PF 1 MG/ML IJ SOLN
0.2500 mg | INTRAMUSCULAR | Status: DC | PRN
  Administered 2013-03-10 (×3): 0.5 mg via INTRAVENOUS
  Administered 2013-03-10: 1 mg via INTRAVENOUS
  Administered 2013-03-10 (×2): 0.5 mg via INTRAVENOUS

## 2013-03-10 MED ORDER — OXYCODONE HCL 5 MG PO TABS: 5.0000 mg | ORAL_TABLET | Freq: Once | ORAL | Status: AC | PRN

## 2013-03-10 MED ORDER — SODIUM CHLORIDE 0.9 % IR SOLN
Status: DC | PRN
  Administered 2013-03-10: 1000 mL

## 2013-03-10 MED ORDER — ROCURONIUM BROMIDE 100 MG/10ML IV SOLN
INTRAVENOUS | Status: DC | PRN
  Administered 2013-03-10: 40 mg via INTRAVENOUS

## 2013-03-10 MED ORDER — HYDROMORPHONE HCL PF 1 MG/ML IJ SOLN
INTRAMUSCULAR | Status: AC
  Administered 2013-03-10: 0.5 mg via INTRAVENOUS
  Filled 2013-03-10: qty 2

## 2013-03-10 MED ORDER — MIDAZOLAM HCL 5 MG/5ML IJ SOLN
INTRAMUSCULAR | Status: DC | PRN
  Administered 2013-03-10: 4 mg via INTRAVENOUS

## 2013-03-10 MED ORDER — ONDANSETRON HCL 4 MG/2ML IJ SOLN
INTRAMUSCULAR | Status: DC | PRN
  Administered 2013-03-10: 4 mg via INTRAVENOUS

## 2013-03-10 MED ORDER — PROPOFOL 10 MG/ML IV BOLUS
INTRAVENOUS | Status: DC | PRN
  Administered 2013-03-10: 200 mg via INTRAVENOUS

## 2013-03-10 MED ORDER — 0.9 % SODIUM CHLORIDE (POUR BTL) OPTIME
TOPICAL | Status: DC | PRN
  Administered 2013-03-10: 2000 mL

## 2013-03-10 MED ORDER — GLYCOPYRROLATE 0.2 MG/ML IJ SOLN
INTRAMUSCULAR | Status: DC | PRN
  Administered 2013-03-10: .6 mg via INTRAVENOUS

## 2013-03-10 MED ORDER — HYDROMORPHONE HCL PF 1 MG/ML IJ SOLN
INTRAMUSCULAR | Status: AC
  Filled 2013-03-10: qty 1

## 2013-03-10 MED ORDER — NEOSTIGMINE METHYLSULFATE 1 MG/ML IJ SOLN
INTRAMUSCULAR | Status: DC | PRN
  Administered 2013-03-10: 4 mg via INTRAVENOUS

## 2013-03-10 MED ORDER — DEXAMETHASONE SODIUM PHOSPHATE 4 MG/ML IJ SOLN
INTRAMUSCULAR | Status: DC | PRN
  Administered 2013-03-10: 4 mg via INTRAVENOUS

## 2013-03-10 MED ORDER — LACTATED RINGERS IV SOLN
INTRAVENOUS | Status: DC
  Administered 2013-03-10: 12:00:00 via INTRAVENOUS

## 2013-03-10 MED ORDER — LACTATED RINGERS IV SOLN
INTRAVENOUS | Status: DC | PRN
  Administered 2013-03-10 (×2): via INTRAVENOUS

## 2013-03-10 SURGICAL SUPPLY — 82 items
APPLIER CLIP ROT 10 11.4 M/L (STAPLE)
BLADE SURG 10 STRL SS (BLADE) ×2 IMPLANT
BLADE SURG ROTATE 9660 (MISCELLANEOUS) IMPLANT
CANISTER SUCTION 2500CC (MISCELLANEOUS) ×2 IMPLANT
CELLS DAT CNTRL 66122 CELL SVR (MISCELLANEOUS) IMPLANT
CHLORAPREP W/TINT 26ML (MISCELLANEOUS) ×2 IMPLANT
CLIP APPLIE ROT 10 11.4 M/L (STAPLE) IMPLANT
COVER MAYO STAND STRL (DRAPES) ×4 IMPLANT
COVER SURGICAL LIGHT HANDLE (MISCELLANEOUS) ×2 IMPLANT
DECANTER SPIKE VIAL GLASS SM (MISCELLANEOUS) ×2 IMPLANT
DRAPE PROXIMA HALF (DRAPES) ×4 IMPLANT
DRAPE UTILITY 15X26 W/TAPE STR (DRAPE) ×10 IMPLANT
DRAPE WARM FLUID 44X44 (DRAPE) ×4 IMPLANT
DRSG OPSITE POSTOP 4X10 (GAUZE/BANDAGES/DRESSINGS) IMPLANT
DRSG OPSITE POSTOP 4X6 (GAUZE/BANDAGES/DRESSINGS) ×2 IMPLANT
DRSG OPSITE POSTOP 4X8 (GAUZE/BANDAGES/DRESSINGS) IMPLANT
ELECT BLADE 6.5 EXT (BLADE) IMPLANT
ELECT CAUTERY BLADE 6.4 (BLADE) ×4 IMPLANT
ELECT REM PT RETURN 9FT ADLT (ELECTROSURGICAL) ×2
ELECTRODE REM PT RTRN 9FT ADLT (ELECTROSURGICAL) ×1 IMPLANT
GEL ULTRASOUND 20GR AQUASONIC (MISCELLANEOUS) IMPLANT
GLOVE BIO SURGEON STRL SZ 6.5 (GLOVE) ×4 IMPLANT
GLOVE BIO SURGEON STRL SZ7 (GLOVE) ×4 IMPLANT
GLOVE BIO SURGEON STRL SZ8 (GLOVE) ×4 IMPLANT
GLOVE BIOGEL PI IND STRL 6.5 (GLOVE) ×1 IMPLANT
GLOVE BIOGEL PI IND STRL 7.0 (GLOVE) ×2 IMPLANT
GLOVE BIOGEL PI IND STRL 7.5 (GLOVE) ×2 IMPLANT
GLOVE BIOGEL PI IND STRL 8 (GLOVE) ×2 IMPLANT
GLOVE BIOGEL PI INDICATOR 6.5 (GLOVE) ×1
GLOVE BIOGEL PI INDICATOR 7.0 (GLOVE) ×2
GLOVE BIOGEL PI INDICATOR 7.5 (GLOVE) ×2
GLOVE BIOGEL PI INDICATOR 8 (GLOVE) ×2
GOWN STRL NON-REIN LRG LVL3 (GOWN DISPOSABLE) ×12 IMPLANT
GOWN STRL REIN XL XLG (GOWN DISPOSABLE) ×2 IMPLANT
KIT BASIN OR (CUSTOM PROCEDURE TRAY) ×2 IMPLANT
KIT ROOM TURNOVER OR (KITS) ×2 IMPLANT
LEGGING LITHOTOMY PAIR STRL (DRAPES) IMPLANT
LIGASURE IMPACT 36 18CM CVD LR (INSTRUMENTS) ×2 IMPLANT
NS IRRIG 1000ML POUR BTL (IV SOLUTION) ×4 IMPLANT
PAD ARMBOARD 7.5X6 YLW CONV (MISCELLANEOUS) ×4 IMPLANT
PENCIL BUTTON HOLSTER BLD 10FT (ELECTRODE) ×4 IMPLANT
RELOAD PROXIMATE 75MM BLUE (ENDOMECHANICALS) ×6 IMPLANT
RTRCTR WOUND ALEXIS 18CM MED (MISCELLANEOUS)
SCALPEL HARMONIC ACE (MISCELLANEOUS) ×2 IMPLANT
SCISSORS LAP 5X35 DISP (ENDOMECHANICALS) IMPLANT
SET IRRIG TUBING LAPAROSCOPIC (IRRIGATION / IRRIGATOR) ×2 IMPLANT
SLEEVE ENDOPATH XCEL 5M (ENDOMECHANICALS) ×4 IMPLANT
SPECIMEN JAR LARGE (MISCELLANEOUS) IMPLANT
SPONGE GAUZE 4X4 12PLY (GAUZE/BANDAGES/DRESSINGS) ×2 IMPLANT
SPONGE LAP 18X18 X RAY DECT (DISPOSABLE) ×2 IMPLANT
STAPLER GUN LINEAR PROX 60 (STAPLE) ×2 IMPLANT
STAPLER PROXIMATE 75MM BLUE (STAPLE) ×2 IMPLANT
STAPLER VISISTAT 35W (STAPLE) ×2 IMPLANT
SURGILUBE 2OZ TUBE FLIPTOP (MISCELLANEOUS) IMPLANT
SUT PDS AB 1 CT  36 (SUTURE)
SUT PDS AB 1 CT 36 (SUTURE) IMPLANT
SUT PROLENE 2 0 CT2 30 (SUTURE) IMPLANT
SUT PROLENE 2 0 KS (SUTURE) IMPLANT
SUT SILK 2 0 (SUTURE) ×1
SUT SILK 2-0 18XBRD TIE 12 (SUTURE) ×1 IMPLANT
SUT SILK 3 0 (SUTURE) ×1
SUT SILK 3-0 18XBRD TIE 12 (SUTURE) ×1 IMPLANT
SUT VIC AB 2-0 SH 18 (SUTURE) ×2 IMPLANT
SUT VIC AB 3-0 54X BRD REEL (SUTURE) IMPLANT
SUT VIC AB 3-0 BRD 54 (SUTURE)
SUT VIC AB 3-0 SH 18 (SUTURE) ×2 IMPLANT
SUT VICRYL AB 2 0 TIES (SUTURE) ×2 IMPLANT
SYR BULB IRRIGATION 50ML (SYRINGE) ×2 IMPLANT
SYS LAPSCP GELPORT 120MM (MISCELLANEOUS) ×2
SYSTEM LAPSCP GELPORT 120MM (MISCELLANEOUS) ×1 IMPLANT
TAPE CLOTH SURG 4X10 WHT LF (GAUZE/BANDAGES/DRESSINGS) ×2 IMPLANT
TOWEL OR 17X26 10 PK STRL BLUE (TOWEL DISPOSABLE) ×4 IMPLANT
TRAY FOLEY CATH 14FRSI W/METER (CATHETERS) ×2 IMPLANT
TRAY LAPAROSCOPIC (CUSTOM PROCEDURE TRAY) ×2 IMPLANT
TRAY PROCTOSCOPIC FIBER OPTIC (SET/KITS/TRAYS/PACK) IMPLANT
TROCAR XCEL BLUNT TIP 100MML (ENDOMECHANICALS) IMPLANT
TROCAR XCEL NON-BLD 11X100MML (ENDOMECHANICALS) ×2 IMPLANT
TROCAR XCEL NON-BLD 5MMX100MML (ENDOMECHANICALS) ×2 IMPLANT
TUBE CONNECTING 12X1/4 (SUCTIONS) ×4 IMPLANT
TUBING FILTER THERMOFLATOR (ELECTROSURGICAL) ×2 IMPLANT
WATER STERILE IRR 1000ML POUR (IV SOLUTION) IMPLANT
YANKAUER SUCT BULB TIP NO VENT (SUCTIONS) ×4 IMPLANT

## 2013-03-10 NOTE — Transfer of Care (Signed)
Immediate Anesthesia Transfer of Care Note  Patient: [name]  Procedure(s) Performed: Procedure(s): LAPAROSCOPIC PARTIAL COLECTOMY (N/A)  Patient Location: PACU  Anesthesia Type:General  Level of Consciousness: awake, sedated and patient cooperative  Airway & Oxygen Therapy: Patient Spontanous Breathing and Patient connected to face mask oxygen  Post-op Assessment: Report given to PACU RN and Post -op Vital signs reviewed and stable  Post vital signs: Reviewed and stable  Complications: No apparent anesthesia complications 

## 2013-03-10 NOTE — Progress Notes (Signed)
1 Day Post-Op  Subjective: No new complaints except HA.  Colonoscopy normal.  Objective: Vital signs in last 24 hours: Temp:  [97.9 F (36.6 C)-98.3 F (36.8 C)] 98.3 F (36.8 C) (11/11 0507) Pulse Rate:  [71-87] 71 (11/11 0507) Resp:  [12-18] 16 (11/11 0507) BP: (136-172)/(74-95) 139/92 mmHg (11/11 0507) SpO2:  [95 %-100 %] 99 % (11/11 0507) Last BM Date: 03/09/13  Intake/Output from previous day: 11/10 0701 - 11/11 0700 In: 1122.9 [I.V.:1122.9] Out: -  Intake/Output this shift:    GI: slight distension and soreness  Lab Results:   Recent Labs  03/07/13 1910 03/09/13 0625  WBC 8.7 4.8  HGB 13.5 12.2  HCT 39.8 37.5  PLT 240 221   BMET  Recent Labs  03/07/13 1910 03/09/13 0625  NA 136 142  K 3.5 3.6  CL 100 109  CO2 25 26  GLUCOSE 111* 103*  BUN 9 5*  CREATININE 0.69 0.68  CALCIUM 9.5 9.7   PT/INR No results found for this basename: LABPROT, INR,  in the last 72 hours ABG No results found for this basename: PHART, PCO2, PO2, HCO3,  in the last 72 hours  Studies/Results: No results found.  Anti-infectives: Anti-infectives   Start     Dose/Rate Route Frequency Ordered Stop   03/08/13 0115  ciprofloxacin (CIPRO) IVPB 400 mg     400 mg 200 mL/hr over 60 Minutes Intravenous Every 12 hours 03/08/13 0041     03/08/13 0115  metroNIDAZOLE (FLAGYL) IVPB 500 mg     500 mg 100 mL/hr over 60 Minutes Intravenous 3 times per day 03/08/13 0041        Assessment/Plan: s/p Procedure(s): COLONOSCOPY (N/A) Discussed laparoscopy and possible laparotomy with hemicolectomy with patient as next step. Colonoscopy normal.  DDX include inflammatory vs neoplastic.  The procedure was discussed with the patient.  Laparoscopic partial colectomy discussed with the patient as well as non operative treatments. The risks of operative management include bleeding,  Infection,  Leak of anastamosis,  Ostomy formation, open procedure,  Sepsis,  Abcess,  Hernia,  DVT,  Pulmonary  complications,  Cardiovascular  complications,  Injury to ureter,  Bladder,kidney,and anesthesia risks,  And death. The patient understands.  Questions answered.   The success of the procedure is 50-85 % for treating the patients symptoms. They agree to proceed.  LOS: 3 days    Gabriel Conry A. 03/10/2013

## 2013-03-10 NOTE — Interval H&P Note (Signed)
History and Physical Interval Note:  03/10/2013 1:41 PM  [name]  has presented today for surgery, with the diagnosis of Abdominal Pain  The various methods of treatment have been discussed with the patient and family. After consideration of risks, benefits and other options for treatment, the patient has consented to  Procedure(s): LAPAROSCOPIC PARTIAL COLECTOMY (N/A) as a surgical intervention .  The patient's history has been reviewed, patient examined, no change in status, stable for surgery.  I have reviewed the patient's chart and labs.  Questions were answered to the patient's satisfaction.     Cheryl Stabenow A.

## 2013-03-10 NOTE — H&P (View-Only) (Signed)
1 Day Post-Op  Subjective: No new complaints except HA.  Colonoscopy normal.  Objective: Vital signs in last 24 hours: Temp:  [97.9 F (36.6 C)-98.3 F (36.8 C)] 98.3 F (36.8 C) (11/11 0507) Pulse Rate:  [71-87] 71 (11/11 0507) Resp:  [12-18] 16 (11/11 0507) BP: (136-172)/(74-95) 139/92 mmHg (11/11 0507) SpO2:  [95 %-100 %] 99 % (11/11 0507) Last BM Date: 03/09/13  Intake/Output from previous day: 11/10 0701 - 11/11 0700 In: 1122.9 [I.V.:1122.9] Out: -  Intake/Output this shift:    GI: slight distension and soreness  Lab Results:   Recent Labs  03/07/13 1910 03/09/13 0625  WBC 8.7 4.8  HGB 13.5 12.2  HCT 39.8 37.5  PLT 240 221   BMET  Recent Labs  03/07/13 1910 03/09/13 0625  NA 136 142  K 3.5 3.6  CL 100 109  CO2 25 26  GLUCOSE 111* 103*  BUN 9 5*  CREATININE 0.69 0.68  CALCIUM 9.5 9.7   PT/INR No results found for this basename: LABPROT, INR,  in the last 72 hours ABG No results found for this basename: PHART, PCO2, PO2, HCO3,  in the last 72 hours  Studies/Results: No results found.  Anti-infectives: Anti-infectives   Start     Dose/Rate Route Frequency Ordered Stop   03/08/13 0115  ciprofloxacin (CIPRO) IVPB 400 mg     400 mg 200 mL/hr over 60 Minutes Intravenous Every 12 hours 03/08/13 0041     03/08/13 0115  metroNIDAZOLE (FLAGYL) IVPB 500 mg     500 mg 100 mL/hr over 60 Minutes Intravenous 3 times per day 03/08/13 0041        Assessment/Plan: s/p Procedure(s): COLONOSCOPY (N/A) Discussed laparoscopy and possible laparotomy with hemicolectomy with patient as next step. Colonoscopy normal.  DDX include inflammatory vs neoplastic.  The procedure was discussed with the patient.  Laparoscopic partial colectomy discussed with the patient as well as non operative treatments. The risks of operative management include bleeding,  Infection,  Leak of anastamosis,  Ostomy formation, open procedure,  Sepsis,  Abcess,  Hernia,  DVT,  Pulmonary  complications,  Cardiovascular  complications,  Injury to ureter,  Bladder,kidney,and anesthesia risks,  And death. The patient understands.  Questions answered.   The success of the procedure is 50-85 % for treating the patients symptoms. They agree to proceed.  LOS: 3 days    Christina Waldrop A. 03/10/2013  

## 2013-03-10 NOTE — Transfer of Care (Signed)
Immediate Anesthesia Transfer of Care Note  Patient: [name]  Procedure(s) Performed: Procedure(s): LAPAROSCOPIC PARTIAL COLECTOMY (N/A)  Patient Location: PACU  Anesthesia Type:General  Level of Consciousness: awake, sedated and patient cooperative  Airway & Oxygen Therapy: Patient Spontanous Breathing and Patient connected to face mask oxygen  Post-op Assessment: Report given to PACU RN and Post -op Vital signs reviewed and stable  Post vital signs: Reviewed and stable  Complications: No apparent anesthesia complications

## 2013-03-10 NOTE — Anesthesia Procedure Notes (Signed)
Procedure Name: Intubation Date/Time: 03/10/2013 2:38 PM Performed by: Orvilla Fus A Pre-anesthesia Checklist: Patient identified, Timeout performed, Emergency Drugs available, Suction available and Patient being monitored Patient Re-evaluated:Patient Re-evaluated prior to inductionOxygen Delivery Method: Circle system utilized Preoxygenation: Pre-oxygenation with 100% oxygen Intubation Type: IV induction Ventilation: Mask ventilation without difficulty Laryngoscope Size: Mac and 4 Grade View: Grade I Tube type: Oral Tube size: 7.5 mm Number of attempts: 1 Airway Equipment and Method: Stylet Placement Confirmation: ETT inserted through vocal cords under direct vision,  breath sounds checked- equal and bilateral and positive ETCO2 Secured at: 22 cm Tube secured with: Tape Dental Injury: Teeth and Oropharynx as per pre-operative assessment

## 2013-03-10 NOTE — Anesthesia Postprocedure Evaluation (Signed)
  Anesthesia Post-op Note  Patient: [name]  Procedure(s) Performed: Procedure(s): LAPAROSCOPIC PARTIAL COLECTOMY (N/A)  Patient Location: PACU  Anesthesia Type:General  Level of Consciousness: awake, alert  and oriented  Airway and Oxygen Therapy: Patient Spontanous Breathing  Post-op Pain: mild  Post-op Assessment: Post-op Vital signs reviewed  Post-op Vital Signs: Reviewed  Complications: No apparent anesthesia complications

## 2013-03-10 NOTE — Brief Op Note (Signed)
03/07/2013 - 03/10/2013  4:56 PM  PATIENT:  Frankey Poot  52 y.o. female  PRE-OPERATIVE DIAGNOSIS:  Abdominal Pain  POST-OPERATIVE DIAGNOSIS:  Abdominal Pain  PROCEDURE:  Procedure(s): LAPAROSCOPIC PARTIAL COLECTOMY (N/A)  SURGEON:  Surgeon(s) and Role:    * Markee Matera A. Avant Printy, MD - Primary    * Wilmon Arms. Corliss Skains, MD - Assisting      ANESTHESIA:   general  EBL:  Total I/O In: 2000 [I.V.:2000] Out: 90 [Urine:90]  BLOOD ADMINISTERED:none  DRAINS: none   LOCAL MEDICATIONS USED:  BUPIVICAINE   SPECIMEN:  Source of Specimen:  right colon  DISPOSITION OF SPECIMEN:  PATHOLOGY  COUNTS:  YES  TOURNIQUET:  * No tourniquets in log *  DICTATION: .Other Dictation: Dictation Number  [id number]  PLAN OF CARE: Admit to inpatient   PATIENT DISPOSITION:  PACU - hemodynamically stable.   Delay start of Pharmacological VTE agent (>24hrs) due to surgical blood loss or risk of bleeding: yes

## 2013-03-10 NOTE — Anesthesia Preprocedure Evaluation (Signed)
Anesthesia Evaluation  Patient identified by MRN, date of birth, ID band Patient awake    Reviewed: Allergy & Precautions, H&P , NPO status , Patient's Chart, lab work & pertinent test results  Airway Mallampati: II  Neck ROM: full    Dental   Pulmonary neg pulmonary ROS,          Cardiovascular hypertension, + Past MI  Pt had h/o non-STEMI.  Cath showed patent coronaries.  Possible vasospasm.  Seen by Dr. Myrtis Ser   Neuro/Psych    GI/Hepatic   Endo/Other  obese  Renal/GU      Musculoskeletal   Abdominal   Peds  Hematology   Anesthesia Other Findings   Reproductive/Obstetrics H/o breast CA                           Anesthesia Physical Anesthesia Plan  ASA: II  Anesthesia Plan: General   Post-op Pain Management:    Induction: Intravenous  Airway Management Planned: Oral ETT  Additional Equipment:   Intra-op Plan:   Post-operative Plan: Extubation in OR  Informed Consent: I have reviewed the patients History and Physical, chart, labs and discussed the procedure including the risks, benefits and alternatives for the proposed anesthesia with the patient or authorized representative who has indicated his/her understanding and acceptance.     Plan Discussed with: CRNA, Anesthesiologist and Surgeon  Anesthesia Plan Comments:         Anesthesia Quick Evaluation

## 2013-03-11 ENCOUNTER — Encounter (HOSPITAL_COMMUNITY): Payer: Self-pay | Admitting: Surgery

## 2013-03-11 MED ORDER — IBUPROFEN 600 MG PO TABS: 600.0000 mg | ORAL_TABLET | Freq: Three times a day (TID) | ORAL | Status: DC | PRN

## 2013-03-11 MED ORDER — OXYCODONE HCL 5 MG PO TABS
10.0000 mg | ORAL_TABLET | Freq: Four times a day (QID) | ORAL | Status: DC | PRN
  Administered 2013-03-11 – 2013-03-16 (×18): 10 mg via ORAL
  Filled 2013-03-11 (×18): qty 2

## 2013-03-11 MED ORDER — KETOROLAC TROMETHAMINE 30 MG/ML IJ SOLN
30.0000 mg | Freq: Four times a day (QID) | INTRAMUSCULAR | Status: AC | PRN
  Administered 2013-03-11 – 2013-03-12 (×2): 30 mg via INTRAVENOUS
  Filled 2013-03-11 (×2): qty 1

## 2013-03-11 NOTE — Progress Notes (Signed)
1 Day Post-Op  Subjective: Pain an issue  Objective: Vital signs in last 24 hours: Temp:  [97.7 F (36.5 C)-99 F (37.2 C)] 99 F (37.2 C) (11/12 0557) Pulse Rate:  [47-82] 69 (11/12 0557) Resp:  [11-19] 16 (11/12 0557) BP: (106-147)/(68-85) 144/81 mmHg (11/12 0557) SpO2:  [93 %-100 %] 99 % (11/12 0557) Last BM Date: 03/09/13  Intake/Output from previous day: 11/11 0701 - 11/12 0700 In: 3415 [I.V.:3415] Out: 1790 [Urine:1740; Blood:50] Intake/Output this shift:    Incision/Wound:C/D/I soft but very sore  Lab Results:   Recent Labs  03/09/13 0625 03/10/13 0733  WBC 4.8 4.3  HGB 12.2 12.2  HCT 37.5 36.6  PLT 221 209   BMET  Recent Labs  03/09/13 0625 03/10/13 0733  NA 142 138  K 3.6 4.1  CL 109 104  CO2 26 24  GLUCOSE 103* 99  BUN 5* 4*  CREATININE 0.68 0.64  CALCIUM 9.7 9.6   PT/INR No results found for this basename: LABPROT, INR,  in the last 72 hours ABG No results found for this basename: PHART, PCO2, PO2, HCO3,  in the last 72 hours  Studies/Results: No results found.  Anti-infectives: Anti-infectives   Start     Dose/Rate Route Frequency Ordered Stop   03/08/13 0115  ciprofloxacin (CIPRO) IVPB 400 mg  Status:  Discontinued     400 mg 200 mL/hr over 60 Minutes Intravenous Every 12 hours 03/08/13 0041 03/11/13 0754   03/08/13 0115  metroNIDAZOLE (FLAGYL) IVPB 500 mg  Status:  Discontinued     500 mg 100 mL/hr over 60 Minutes Intravenous 3 times per day 03/08/13 0041 03/11/13 0754      Assessment/Plan: s/p Procedure(s): LAPAROSCOPIC PARTIAL COLECTOMY (N/A) D/C foley OOB Add toradol for pain Clears   LOS: 4 days    Audry Kauzlarich A. 03/11/2013

## 2013-03-12 MED ORDER — HEPARIN SODIUM (PORCINE) 5000 UNIT/ML IJ SOLN
5000.0000 [IU] | Freq: Three times a day (TID) | INTRAMUSCULAR | Status: DC
  Administered 2013-03-12 – 2013-03-16 (×11): 5000 [IU] via SUBCUTANEOUS
  Filled 2013-03-12 (×16): qty 1

## 2013-03-12 NOTE — Progress Notes (Signed)
2 Days Post-Op   Assessment: s/p Procedure(s): LAPAROSCOPIC PARTIAL COLECTOMY Patient Active Problem List   Diagnosis Date Noted  . 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 Non-STEMI (non-ST elevated myocardial infarction)   . Hypertension   . Dyslipidemia   . Seasonal and perennial allergic rhinitis 12/16/2007    Stable post op, no issues identified  Plan: Advance diet  Subjective: Feels OK, has ambulated a fair amount, no nausea, tolerating clear liquids, not passed gas, but feels it "bubbling", pain mainly in midline incision  Objective: Vital signs in last 24 hours: Temp:  [98.8 F (37.1 C)-99.9 F (37.7 C)] 99.8 F (37.7 C) (11/13 0626) Pulse Rate:  [67-90] 90 (11/13 0626) Resp:  [15-20] 20 (11/13 0626) BP: (130-149)/(69-86) 138/84 mmHg (11/13 0626) SpO2:  [95 %-99 %] 96 % (11/13 0626)   Intake/Output from previous day: 11/12 0701 - 11/13 0700 In: 2819.6 [P.O.:730; I.V.:2089.6] Out: 2575 [Urine:2575]  General appearance: alert, cooperative and no distress Resp: clear to auscultation bilaterally Cardio: regular rate and rhythm, S1, S2 normal, no murmur, click, rub or gallop GI: Soft, mildly tender at incision, no guarding, BS present, a few  Incision: healing well, no significant drainage  Lab Results:   Recent Labs  03/10/13 0733  WBC 4.3  HGB 12.2  HCT 36.6  PLT 209   BMET  Recent Labs  03/10/13 0733  NA 138  K 4.1  CL 104  CO2 24  GLUCOSE 99  BUN 4*  CREATININE 0.64  CALCIUM 9.6    MEDS, Scheduled . pantoprazole  40 mg Oral Daily    Studies/Results: No results found.    LOS: 5 days     Currie Paris, MD, [facility], [place] [phone]   03/12/2013 7:30 AM

## 2013-03-13 ENCOUNTER — Other Ambulatory Visit (INDEPENDENT_AMBULATORY_CARE_PROVIDER_SITE_OTHER): Payer: Self-pay

## 2013-03-13 ENCOUNTER — Telehealth (INDEPENDENT_AMBULATORY_CARE_PROVIDER_SITE_OTHER): Payer: Self-pay

## 2013-03-13 DIAGNOSIS — C181 Malignant neoplasm of appendix: Secondary | ICD-10-CM

## 2013-03-13 NOTE — Telephone Encounter (Signed)
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  Has had resection but needs opinion on next steps of treatment.  Hopefully home over the weekend.       Thanks       TC ------

## 2013-03-13 NOTE — Telephone Encounter (Signed)
Referral completed and sent to [facility] for scheduling.

## 2013-03-13 NOTE — Progress Notes (Signed)
3 Days Post-Op  Subjective: Pt without complaint.  No flatus yet.   Objective: Vital signs in last 24 hours: Temp:  [98 F (36.7 C)-99.9 F (37.7 C)] 99.9 F (37.7 C) (11/14 0557) Pulse Rate:  [94-103] 94 (11/14 0557) Resp:  [20] 20 (11/14 0557) BP: (128-140)/(72-82) 136/78 mmHg (11/14 0557) SpO2:  [93 %-97 %] 94 % (11/14 0557) Last BM Date: 03/10/13  Intake/Output from previous day: 11/13 0701 - 11/14 0700 In: 721 [P.O.:480; I.V.:241] Out: 300 [Urine:300] Intake/Output this shift:    Incision/Wound:C/D/I  Soft BS present  Lab Results:  No results found for this basename: WBC, HGB, HCT, PLT,  in the last 72 hours BMET No results found for this basename: NA, K, CL, CO2, GLUCOSE, BUN, CREATININE, CALCIUM,  in the last 72 hours PT/INR No results found for this basename: LABPROT, INR,  in the last 72 hours ABG No results found for this basename: PHART, PCO2, PO2, HCO3,  in the last 72 hours  Studies/Results: No results found. 1. Colon, segmental resection for tumor, Right colon w/appendix - APPENDICIAL MUCINOUS ADENOCARCINOMA, HIGH GRADE. - TUMOR INVADES TERMINAL ILEUM. - DISTAL AND PROXIMAL MARGINS NEGATIVE FOR CARCINOMA. - TWO OF FOURTEEN LYMPH NODES POSITIVE FOR METASTATIC CARCINOMA (2/14). - SEE ONCOLOGY REPORT. 2. Colon, segmental resection for tumor, Hepatic fixture - INVOLVEMENT BY MUCINOUS CARCINOMA PERITONEI, HIGH GRADE. - RESECTION MARGINS UNINVOLVED BY CARCINOMA. - ONE LYMPH NODE NEGATIVE FOR CARCINOMA (0/1). Microscopic Comment 1. APPENDIX: Specimen: Right colon with appendix Procedure: Right segmental resection and appendix Specimen Integrity: Intact Specimen Size: 11.3 cm appendix, 20.4 cm ileum, 15.3 cm colon Tumor Site: Appendix Tumor Size: 11.3 cm Histologic Type: Mucinous adenocarcinoma Histologic Grade: 2 Grade 1 (well differentiated) Grade 2 (moderately differentiated) Grade 3 (poorly differentiated) Grade 4 (undifferentiated) Microscopic  Tumor Extension: Tumor invades adjacent ileum via serosa Margins: Proximal Margin: Negative Mesenteric Margin: Negative If all margins uninvolved - Distance of tumor from closest margin:11.3 cm. Specific margin: proximal Adenoma present at proximal margin: No Lymph-Vascular Invasion: Present Perineural Invasion: Absent 1 of 3 FINAL for [name], [name] ([id number]) Microscopic Comment(continued) Peritumoral Nodules (tumor deposits): Present. Lymph nodes: number examined 14; number positive: 2 TNM: pT4b, pN1 Ancillary studies: Not performed. Comments: The tumor distends the appendix, extending through the serosa and directly into adherent ileum including the mucosal surface. Adherent colon exhibits tumor without invasion. The additional segment of colon reveals a serosal tumor deposit with invasion from the serosa through the muscularis propria into the submucosal tissue. Dr. Luisa Hart has reviewed the case. Valinda Hoar MD Pathologist, Electronic Signature (Case signed 03/12/2013) Specimen Gross and Clinical Information Anti-infectives: Anti-infectives   Start     Dose/Rate Route Frequency Ordered Stop   03/08/13 0115  ciprofloxacin (CIPRO) IVPB 400 mg  Status:  Discontinued     400 mg 200 mL/hr over 60 Minutes Intravenous Every 12 hours 03/08/13 0041 03/11/13 0754   03/08/13 0115  metroNIDAZOLE (FLAGYL) IVPB 500 mg  Status:  Discontinued     500 mg 100 mL/hr over 60 Minutes Intravenous 3 times per day 03/08/13 0041 03/11/13 0754      Assessment/Plan: s/p Procedure(s): LAPAROSCOPIC PARTIAL COLECTOMY (N/A) 1. Colon, segmental resection for tumor, Right colon w/appendix - APPENDICIAL MUCINOUS ADENOCARCINOMA, HIGH GRADE. - TUMOR INVADES TERMINAL ILEUM. - DISTAL AND PROXIMAL MARGINS NEGATIVE FOR CARCINOMA. - TWO OF FOURTEEN LYMPH NODES POSITIVE FOR METASTATIC CARCINOMA (2/14). - SEE ONCOLOGY REPORT. 2. Colon, segmental resection for tumor, Hepatic fixture - INVOLVEMENT BY MUCINOUS  CARCINOMA PERITONEI, HIGH  GRADE. - RESECTION MARGINS UNINVOLVED BY CARCINOMA. - ONE LYMPH NODE NEGATIVE FOR CARCINOMA (0/1). Microscopic Comment 1. APPENDIX: Specimen: Right colon with appendix Procedure: Right segmental resection and appendix Specimen Integrity: Intact Specimen Size: 11.3 cm appendix, 20.4 cm ileum, 15.3 cm colon Tumor Site: Appendix Tumor Size: 11.3 cm Histologic Type: Mucinous adenocarcinoma Histologic Grade: 2 Grade 1 (well differentiated) Grade 2 (moderately differentiated) Grade 3 (poorly differentiated) Grade 4 (undifferentiated) Microscopic Tumor Extension: Tumor invades adjacent ileum via serosa Margins: Proximal Margin: Negative Mesenteric Margin: Negative If all margins uninvolved - Distance of tumor from closest margin:11.3 cm. Specific margin: proximal Adenoma present at proximal margin: No Lymph-Vascular Invasion: Present Perineural Invasion: Absent 1 of 3 FINAL for [name], [name] ([id number]) Microscopic Comment(continued) Peritumoral Nodules (tumor deposits): Present. Lymph nodes: number examined 14; number positive: 2 TNM: pT4b, pN1 Ancillary studies: Not performed. Comments: The tumor distends the appendix, extending through the serosa and directly into adherent ileum including the mucosal surface. Adherent colon exhibits tumor without invasion. The additional segment of colon reveals a serosal tumor deposit with invasion from the serosa through the muscularis propria into the submucosal tissue. Dr. Luisa Hart has reviewed the case. Valinda Hoar MD Pathologist, Electronic Signature (Case signed 03/12/2013) Specimen Gross and Clinical Information  LOS: 6 days  Post op #3 right colectomy.  Path reviewed.  Will refer as outpatient to [facility] for post op evaluation and subsequent treatment.   Await return of bowel function.   Kianni Lheureux A. 03/13/2013

## 2013-03-13 NOTE — Op Note (Signed)
[name], [name]                  ACCOUNT NO.:  [id number]  MEDICAL RECORD NO.:  [id number]  LOCATION:  6N01C                        FACILITY:  [facility]  PHYSICIAN:  Maisie Fus A. Delmont Prosch, M.D.DATE OF BIRTH:  Sep 06, 1960  DATE OF PROCEDURE:  03/10/2013 DATE OF DISCHARGE:                              OPERATIVE REPORT   PREOPERATIVE DIAGNOSIS:  Inflammatory change involving right lower quadrant.  POSTOPERATIVE DIAGNOSIS:  Mass of appendix.  PROCEDURE:  Laparoscopic-assisted right hemicolectomy.  SURGEON:  Maisie Fus A. Jaimya Feliciano, M.D.  ASSISTANT:  Wilmon Arms. Tsuei, M.D.  ESTIMATED BLOOD LOSS:  Approximately 200 mL.  SPECIMENS:  Terminal ileum, right colon, and hepatic flexure to Pathology.  DRAINS:  None.  IV FLUIDS:  Approximately 2 L of crystalloid.  INDICATIONS FOR PROCEDURE:  The patient is a 52 year old female who was admitted for right lower quadrant pain over this past weekend.  She underwent extensive workup, which includes colonoscopy and CT scan, which showed inflammatory changes involving the cecum and right colon, worrisome for possible appendiceal malignancy versus inflammatory change.  She has been bowel prepped, and I felt laparoscopy is the next step since her colonoscopy was normal.  Risks, benefits, and alternative therapies discussed of her condition.  Risks of operative intervention and the possibility of a colon resection with anastomosis were discussed with the patient.  Risk of bleeding, infection, anastomotic leak, intraabdominal obstruction or abscess, and the need for revision of surgery if necessary, death, DVT, pulmonary embolus, other cardiovascular events, and the need for further therapy depending on findings were all discussed.  I went over possibility of a ruptured appendix, appendiceal carcinoma, colon cancer, as well as an inflammatory process like Crohn's disease.  She understood all the possible different scenarios as well as complications and she  agreed to proceed.  DESCRIPTION OF PROCEDURE:  The patient was met in the holding area and questions were answered.  She was then brought back to the operating room and placed supine on the OR table.  After induction of general anesthesia, Foley catheter was placed.  Left arm was tucked and her abdomen was prepped and draped in sterile fashion.  Time-out was done. A 5-mm Optiview trocar was placed in the left lower quadrant under direct vision.  The abdominal cavity was entered easily. Pneumoperitoneum was created to 50 mmHg of CO2 and a laparoscope was placed with a 5 mm scope.  I placed 2 additional ports, one in the lower midline of the abdomen and one in the upper midline of the abdomen using 5-mm ports.  Laparoscopy was performed.  Liver and gallbladder were normal.  Stomach normal.  Small bowel was normal.  Descending colon as well as rectum were normal.  There were some changes around the cecum. I then mobilized the cecum.  This appeared to be a large appendiceal mass, which was retrocecal extending all the way up to the hepatic flexure.  I used the Harmonic Scalpel and sharp dissection to mobilize the colon with and attempt to not disrupt this mass since it did not look like appendicitis or perforated appendix, but more like a neoplastic process.  I felt that a right colon resection would be appropriate in  this setting.  Once I mobilized the colon, I then placed a midline hand port using a 7 cm incision and GelPort.  I then put my hand into the abdominal cavity with laparoscopic guidance.  The tumor was large involving most of the right colon up.  I mobilized the entire right colon all the way to the window between the right colic and middle colic blood supply.  Once I felt like I mobilized well enough, the specimen was pulled out through the hand port leaving the wound protector in place.  Tumor is quite large.  It took up most right colon. Proximal and distal areas of colon as  well as small bowel and colon were identified and divided by GIA 75 stapling devices.  The mesentery was taken to the root of mesenteric LigaSure and the entire specimen was removed.  Upon palpating the hepatic flexure, small mass was palpated within the wall of the colon.  I took some additional colon with additional firing of the GIA-75 stapling device.  I then created a side- to-side anastomosis using a GIA-75 stapling device and TA-60 to close the common enterotomy.  This was not twisted.  The common defect was not closed.  I saw some oozing from the staple line.  We then changed instruments, gowns, and re-draped.  The wound protector was removed prior to doing this and specimen was dropped back into the abdominal cavity.  The anastomosis was widely patent and a crotch stitch was placed using 2-0 Vicryl.  I then closed the fascia with #1 running PDS. Staples were used to close the skin after irrigation.  Laparoscope was then replaced.  Anastomosis occurred without  any twisting.  Irrigation was used to suction out.  It was relatively clear with minimal blood. There was no evidence of any active bleeding.  At this point in time, we removed all of our ports and passed them off the field after the CO2 escaped.  Staples were used to close the skin.  Dressings were applied. All final counts of sponge, needle, and instruments were found to be correct at this portion of the case.  The patient was awoken, extubated, and taken to recovery in a satisfactory condition.     Adriyana Greenbaum A. Ovie Eastep, M.D.     TAC/MEDQ  D:  03/10/2013  T:  03/11/2013  Job:  [id number]

## 2013-03-14 ENCOUNTER — Encounter (HOSPITAL_COMMUNITY): Payer: Self-pay | Admitting: Surgery

## 2013-03-14 NOTE — Progress Notes (Signed)
4 Days Post-Op   Assessment: s/p Procedure(s): LAPAROSCOPIC PARTIAL COLECTOMY Patient Active Problem List   Diagnosis Date Noted  . 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 Non-STEMI (non-ST elevated myocardial infarction)   . Hypertension   . Dyslipidemia   . Seasonal and perennial allergic rhinitis 12/16/2007    Continuing to progress  Plan: Advance diet  Subjective: Feels OK, passing gas and taking full liquids, no nausea  Objective: Vital signs in last 24 hours: Temp:  [98.5 F (36.9 C)-99.2 F (37.3 C)] 98.5 F (36.9 C) (11/15 0617) Pulse Rate:  [98-100] 98 (11/15 0617) Resp:  [18] 18 (11/15 0617) BP: (133-143)/(73-82) 133/78 mmHg (11/15 0617) SpO2:  [95 %-100 %] 95 % (11/15 0617)   Intake/Output from previous day: 11/14 0701 - 11/15 0700 In: 840 [P.O.:840] Out: -   General appearance: alert, cooperative and no distress Resp: clear to auscultation bilaterally Cardio: regular rate and rhythm, S1, S2 normal, no murmur, click, rub or gallop GI: Soft, not tender, BS+  Incision: healing well  Lab Results:  No results found for this basename: WBC, HGB, HCT, PLT,  in the last 72 hours BMET No results found for this basename: NA, K, CL, CO2, GLUCOSE, BUN, CREATININE, CALCIUM,  in the last 72 hours  MEDS, Scheduled . heparin subcutaneous  5,000 Units Subcutaneous Q8H  . pantoprazole  40 mg Oral Daily    Studies/Results: No results found.    LOS: 7 days     Currie Paris, MD, [facility], [place] [phone]   03/14/2013 8:13 AM

## 2013-03-15 NOTE — Progress Notes (Signed)
5 Days Post-Op   Assessment: s/p Procedure(s): LAPAROSCOPIC PARTIAL COLECTOMY Patient Active Problem List   Diagnosis Date Noted  . 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 Non-STEMI (non-ST elevated myocardial infarction)   . Hypertension   . Dyslipidemia   . Seasonal and perennial allergic rhinitis 12/16/2007    Stable, overall seems to be improving; small hematoma and/or developing wound infection lower part of midline  Plan: Continue current diet: opened bottom end of wound and drained red somewhat purulent fluid, steril gauze pack placed and culture sent  Subjective: Feels OK, no BM but passing lots of gas, notes some pain at bottom of midline incision  Objective: Vital signs in last 24 hours: Temp:  [98.1 F (36.7 C)-100.2 F (37.9 C)] 98.1 F (36.7 C) (11/16 0722) Pulse Rate:  [87-98] 87 (11/16 0722) Resp:  [16-18] 16 (11/16 0722) BP: (118-121)/(65) 118/65 mmHg (11/16 0722) SpO2:  [93 %-100 %] 93 % (11/16 0722)   Intake/Output from previous day: 11/15 0701 - 11/16 0700 In: 240 [P.O.:240] Out: -   General appearance: alert, cooperative and no distress GI: soft, non-tender; bowel sounds normal; no masses,  no organomegaly  Incision: Has developed some redness and induration at the inferior aspect of the midline wound.   Lab Results:  No results found for this basename: WBC, HGB, HCT, PLT,  in the last 72 hours BMET No results found for this basename: NA, K, CL, CO2, GLUCOSE, BUN, CREATININE, CALCIUM,  in the last 72 hours  MEDS, Scheduled . heparin subcutaneous  5,000 Units Subcutaneous Q8H  . pantoprazole  40 mg Oral Daily    Studies/Results: No results found.    LOS: 8 days     Currie Paris, MD, [facility], [place] [phone]   03/15/2013 7:41 AM

## 2013-03-16 LAB — CBC
Hemoglobin: 10 g/dL — ABNORMAL LOW (ref 12.0–15.0)
MCH: 27.8 pg (ref 26.0–34.0)
MCV: 81.9 fL (ref 78.0–100.0)
Platelets: 207 10*3/uL (ref 150–400)
RBC: 3.6 MIL/uL — ABNORMAL LOW (ref 3.87–5.11)
WBC: 6.2 10*3/uL (ref 4.0–10.5)

## 2013-03-16 MED ORDER — OXYCODONE HCL 10 MG PO TABS: 5.0000 mg | ORAL_TABLET | ORAL | Status: DC | PRN

## 2013-03-16 NOTE — Discharge Summary (Signed)
Patient ID: [name] MRN: [id number] DOB/AGE: 1960/09/24 52 y.o.  Admit date: 03/07/2013 Discharge date: 03/16/2013  Procedures: Laparoscopic-assisted right hemicolectomy 03-10-13 by Dr. Luisa Hart  Consults: GI  Reason for Admission: This is a 52 year old female with a history of intermittent right lower quadrant abdominal pain which started around August of this year. It is intermittent and sharp. She had a CAT scan in September with abnormal findings in the right lower quadrant. She had seen a gastroenterologist who has been treating her. She had a negative colonoscopy around a year prior to this. She has had some mild constipation. She had one episode of nausea and vomiting. The pain is mild to moderate in intensity. She has no fevers or chills.  Admission Diagnoses:  1. RLQ abdominal pain Patient Active Problem List   Diagnosis Date Noted  .    Marland Kitchen Non-STEMI (non-ST elevated myocardial infarction)   . Hypertension   . Dyslipidemia   . Seasonal and perennial allergic rhinitis 12/16/2007  . 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  Given the abnormality on her scan GI was consulted for colonoscopy.  Her colonoscopy was negative.  Because of the concern for appendicitis vs neoplasm, she was taken to the OR where she underwent a lap assisted right hemicolectomy.  Pathology revealed a mucinous adenocarcinoma that was high grade.  She did have one peritoneal nodule as well.  There was no other gross disease.  She tolerated this procedure well.  By POD 4, she started passing flatus.  Her diet was then able to be advanced as tolerated.  On POD 5, she was noted to have some erythema of the inferior portion of her midline incision.  This was opened a small amount.  Purulent drainage was expressed. NS WD dressing changes were started.  Culture was sent, but is still pending at time of discharge.  Her erythema has resolved on POD6, which  is day of discharge.  HH will be set up to assist with dressing changes at home.  She is otherwise stable for dc home.  PE: Abd: soft, minimally tender, wound is clean and packed.  No further erythema noted.  All other incisions are healing well with staple in place.  +BS, ND   Discharge Diagnoses:  Principal Problem:   Stage 4 appendiceal mucinous adenocarcinoma S/P laparoscopic assisted right hemicolectomy H/O MI H/O breast Cancer HTN   Discharge Medications:   Medication List         LINZESS 145 MCG Caps capsule  Generic drug:  Linaclotide  Take 145 mcg by mouth daily.     Oxycodone HCl 10 MG Tabs  Take 0.5-1 tablets (5-10 mg total) by mouth every 4 (four) hours as needed for moderate pain.        Discharge Instructions:     Follow-up Information   Follow up with [facility] On 03/19/2013. (11:00am for staple removal)    Contact information:   [address] [place] [address] [phone]      Follow up with Dortha Schwalbe., MD On 03/30/2013. (2:10pm, arrive by 1:50pm for paperwork)    Specialty:  General Surgery   Contact information:   [address] [place] [address] [phone]       Signed: Letha Cape 03/16/2013, 9:39 AM

## 2013-03-16 NOTE — Care Management Note (Signed)
  Page 1 of 1   03/16/2013     10:08:06 AM   CARE MANAGEMENT NOTE 03/16/2013  Patient:  [name], [name]   Account Number:  [id number]  Date Initiated:  03/16/2013  Documentation initiated by:  Ronny Flurry  Subjective/Objective Assessment:     Action/Plan:   Anticipated DC Date:  03/16/2013   Anticipated DC Plan:  HOME W HOME HEALTH SERVICES         Choice offered to / List presented to:  C-1 Patient        HH arranged  HH-1 RN      [facility] agency  [facility]   Status of service:  Completed, signed off Medicare Important Message given?   (If response is "NO", the following Medicare IM given date fields will be blank) Date Medicare IM given:   Date Additional Medicare IM given:    Discharge Disposition:    Per UR Regulation:    If discussed at Long Length of Stay Meetings, dates discussed:    Comments:  03-16-13 Face sheet information confirmed with patient. Ronny Flurry RN BSN [phone]

## 2013-03-16 NOTE — Progress Notes (Signed)
DC instructions reviewed with patient, questions answered, verbalized understanding.  Patient transported to front of hospital via wheelchair accompanied by nurse tech and husband.  Patient in good condition upon leaving [facility].

## 2013-03-17 LAB — BODY FLUID CULTURE: Gram Stain: NONE SEEN

## 2013-03-18 ENCOUNTER — Telehealth (INDEPENDENT_AMBULATORY_CARE_PROVIDER_SITE_OTHER): Payer: Self-pay | Admitting: *Deleted

## 2013-03-18 ENCOUNTER — Encounter (INDEPENDENT_AMBULATORY_CARE_PROVIDER_SITE_OTHER): Payer: Self-pay | Admitting: *Deleted

## 2013-03-18 NOTE — Telephone Encounter (Signed)
Pt returned my call and was made aware of appt information below.

## 2013-03-18 NOTE — Telephone Encounter (Signed)
LMOM for pt to return my call.  I was calling pt to inform her of her appt with Dr. Deveron Furlong at [facility] on 03/24/13 with an arrival time of 1:00pm.  They are located at [facility] in [place].  The pt is to go in [facility] to the 4th floor.  Their phone number is [phone].  I will also mail a letter with these instructions.

## 2013-03-19 ENCOUNTER — Ambulatory Visit (INDEPENDENT_AMBULATORY_CARE_PROVIDER_SITE_OTHER): Payer: 59 | Admitting: General Surgery

## 2013-03-19 ENCOUNTER — Encounter (INDEPENDENT_AMBULATORY_CARE_PROVIDER_SITE_OTHER): Payer: Self-pay | Admitting: General Surgery

## 2013-03-19 VITALS — BP 124/78 | HR 80 | Temp 98.6°F | Resp 14 | Ht 67.0 in | Wt 190.0 lb

## 2013-03-19 DIAGNOSIS — Z4802 Encounter for removal of sutures: Secondary | ICD-10-CM

## 2013-03-19 NOTE — Patient Instructions (Signed)
Patient came in for staple removal. I removed the staples and placed steri streps on the surgery site, the patient stated that her husband placed a new wet gauze in the wound before she came up here to the office and she did not want me to remove it. The surgery site looked good. I told the patient to keep her apt with Dr Luisa Hart and call us if she needs to

## 2013-03-20 ENCOUNTER — Encounter (INDEPENDENT_AMBULATORY_CARE_PROVIDER_SITE_OTHER): Payer: Self-pay | Admitting: General Surgery

## 2013-03-20 ENCOUNTER — Ambulatory Visit (INDEPENDENT_AMBULATORY_CARE_PROVIDER_SITE_OTHER): Payer: 59 | Admitting: General Surgery

## 2013-03-20 VITALS — BP 128/74 | HR 72 | Temp 99.2°F | Resp 14 | Ht 67.0 in | Wt 191.6 lb

## 2013-03-20 DIAGNOSIS — T8140XA Infection following a procedure, unspecified, initial encounter: Secondary | ICD-10-CM

## 2013-03-20 MED ORDER — SULFAMETHOXAZOLE-TRIMETHOPRIM 400-80 MG PO TABS: 1.0000 | ORAL_TABLET | Freq: Two times a day (BID) | ORAL | Status: AC

## 2013-03-20 NOTE — Progress Notes (Signed)
52 year old Caucasian female who was recently in the hospital from November 8 Through the 17th For a laparoscopic assisted right hemicolectomy for mucinous adenocarcinoma comes in because of concerns of her midline incision. A small portion of her wound was opened on postoperative day 5. Since then she has been doing wet to dry dressings through the small opening with home health care nursing. On today's dressing change, they noticed that the drainage was cloudy and much thicker. There is no order. She denies any fever, chills, nausea or vomiting. She reports normal bowel movements.  BP 128/74  Pulse 72  Temp(Src) 99.2 F (37.3 C) (Temporal)  Resp 14  Ht 5\' 7"  (1.702 m)  Wt 191 lb 9.6 oz (86.909 kg)  BMI 30.00 kg/m2 Alert, no apparent distress Mini midline incision Is partially opened at the inferior aspect of the incision for about 2 cm. There is no cellulitis. However there is drainage of purulent fluid. It appears to be adequately draining. The wound was probed with a cotton tip applicator and the fascia appears intact.  Status post laparoscopic assisted right hemicolectomy with surgical site infection  The wound was cultured prior to discharge and the wound culture has come back as MRSA. I gave her a prescription for Bactrim for one week. Continue daily dressing changes and packing the wound with home health nursing. She was instructed on what to call for. She already has an appointment scheduled with Dr. Luisa Hart. At this point I do not believe she needs a CT scan since she is afebrile, not tachycardic, tolerating a regular diet and without abdominal pain  Mary Sella. Andrey Campanile, MD, FACS General, Bariatric, & Minimally Invasive Surgery [facility], [place]

## 2013-03-20 NOTE — Patient Instructions (Signed)
Wound infection  Pick up antibiotic  . Gauze may have been packed into the space to provide a drain that will allow the cavity to heal from the inside outwards. The boil may be painful for 5 to 7 days. Most people with a boil do not have high fevers. Your abscess, if seen early, may not have localized, and may not have been lanced. If not, another appointment may be required for this if it does not get better on its own or with medications.  HOME CARE INSTRUCTIONS   Only take over-the-counter or prescription medicines for pain, discomfort, or fever as directed by your caregiver.   When you bathe, soak and then remove gauze or iodoform packs. You may then wash the wound gently with mild soapy water. Repack with packing strip or gauze or do as your caregiver directs.   SEEK IMMEDIATE MEDICAL CARE IF:   You develop increased pain, swelling, redness, drainage, or bleeding in the wound site.   You develop signs of generalized infection including muscle aches, chills, fever, or a general ill feeling.   An oral temperature above 102 F (38.9 C) develops, not controlled by medication.  See your caregiver for a recheck if you develop any of the symptoms described above. If medications (antibiotics) were prescribed, take them as directed.

## 2013-03-25 ENCOUNTER — Telehealth (INDEPENDENT_AMBULATORY_CARE_PROVIDER_SITE_OTHER): Payer: Self-pay

## 2013-03-25 ENCOUNTER — Other Ambulatory Visit (INDEPENDENT_AMBULATORY_CARE_PROVIDER_SITE_OTHER): Payer: Self-pay | Admitting: *Deleted

## 2013-03-25 MED ORDER — OXYCODONE HCL 5 MG PO TABS: 5.0000 mg | ORAL_TABLET | ORAL | Status: DC | PRN

## 2013-03-25 NOTE — Telephone Encounter (Signed)
Patient is asking for OxyContin 10 mg refill for abdomen wound pain/dressing changes. Rates 6 W/O oxy. Advised patient she would need to pick up if authorized

## 2013-03-25 NOTE — Telephone Encounter (Signed)
Looks like this request was sent only to Dr Luisa Hart who is off after call today. Patient calling back - she lives 30 minutes away and knows she would have to come to office to pick this up. Will forward to our urgent office MD for advise since Dr Luisa Hart not available.

## 2013-03-30 ENCOUNTER — Ambulatory Visit (INDEPENDENT_AMBULATORY_CARE_PROVIDER_SITE_OTHER): Payer: 59 | Admitting: Surgery

## 2013-03-30 ENCOUNTER — Encounter (INDEPENDENT_AMBULATORY_CARE_PROVIDER_SITE_OTHER): Payer: Self-pay | Admitting: Surgery

## 2013-03-30 VITALS — BP 138/80 | HR 80 | Temp 98.6°F | Resp 14 | Ht 67.0 in | Wt 191.0 lb

## 2013-03-30 DIAGNOSIS — Z9889 Other specified postprocedural states: Secondary | ICD-10-CM

## 2013-03-30 MED ORDER — OXYCODONE HCL 5 MG PO TABS: 5.0000 mg | ORAL_TABLET | ORAL | Status: DC | PRN

## 2013-03-30 NOTE — Progress Notes (Signed)
Patient returns for wound check. 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 She developed wound infection this was opened and packed. She still has pain around the incision and abdominal pain. She is moving her bowels once every 2-3 days which is normal for her. She has been seen at [facility] Department of surgical and medical oncology. She will be followed by them in the future. She has completed her antibiotics for an MRSA wound infection.  Exam: Midline incision closed except for the bottom portion which has a small wick of packing that was removed. This is clean with no erythema. No visible pus draining from wound. Sore to palpate. Abdomen is sore to palpate. No peritonitis.  1. Colon, segmental resection for tumor, Right colon w/appendix - APPENDICIAL MUCINOUS ADENOCARCINOMA, HIGH GRADE. - TUMOR INVADES TERMINAL ILEUM. - DISTAL AND PROXIMAL MARGINS NEGATIVE FOR CARCINOMA. - TWO OF FOURTEEN LYMPH NODES POSITIVE FOR METASTATIC CARCINOMA (2/14). - SEE ONCOLOGY REPORT. 2. Colon, segmental resection for tumor, Hepatic fixture - INVOLVEMENT BY MUCINOUS CARCINOMA PERITONEI, HIGH GRADE. - RESECTION MARGINS UNINVOLVED BY CARCINOMA. - ONE LYMPH NODE NEGATIVE FOR CARCINOMA (0/1). Microscopic Comment 1. APPENDIX: Specimen: Right colon with appendix Procedure: Right segmental resection and appendix Specimen Integrity: Intact Specimen Size: 11.3 cm appendix, 20.4 cm ileum, 15.3 cm colon Tumor Site: Appendix Tumor Size: 11.3 cm Histologic Type: Mucinous adenocarcinoma Histologic Grade: 2 Grade 1 (well differentiated) Grade 2 (moderately differentiated) Grade 3 (poorly differentiated) Grade 4 (undifferentiated) Microscopic Tumor Extension: Tumor invades adjacent ileum via serosa Margins: Proximal Margin: Negative Mesenteric Margin: Negative If all margins uninvolved - Distance of tumor from  closest margin:11.3 cm. Specific margin: proximal Adenoma present at proximal margin: No Lymph-Vascular Invasion: Present Perineural Invasion: Absent 1 of 3 FINAL for MIANGEL, FLOM ([id number]) Microscopic Comment(continued) Peritumoral Nodules (tumor deposits): Present. Lymph nodes: number examined 14; number positive: 2 TNM: pT4b, pN1     Impression: Status post right hemicolectomy for T4 B. N1MX mucinous adenocarcinoma of appendix with postop wound infection improved  Plan: Continue followup at [facility]. Followup in the next week for wound check. Change packing once a day.

## 2013-03-30 NOTE — Patient Instructions (Signed)
Change packing once a day for now.  Walk as tolerated. Return next Friday.

## 2013-04-10 ENCOUNTER — Ambulatory Visit (INDEPENDENT_AMBULATORY_CARE_PROVIDER_SITE_OTHER): Payer: 59 | Admitting: Surgery

## 2013-04-10 ENCOUNTER — Encounter (INDEPENDENT_AMBULATORY_CARE_PROVIDER_SITE_OTHER): Payer: Self-pay | Admitting: Surgery

## 2013-04-10 VITALS — BP 126/78 | HR 76 | Temp 98.6°F | Resp 14 | Ht 67.0 in | Wt 190.6 lb

## 2013-04-10 DIAGNOSIS — Z9889 Other specified postprocedural states: Secondary | ICD-10-CM

## 2013-04-10 MED ORDER — OXYCODONE HCL 5 MG PO TABS: 5.0000 mg | ORAL_TABLET | ORAL | Status: DC | PRN

## 2013-04-10 NOTE — Patient Instructions (Signed)
Pack for 1 more week and stop.  Return 1 month.

## 2013-04-10 NOTE — Progress Notes (Signed)
Patient returns for wound check. 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 She developed wound infection this was opened and packed. She still has pain around the incision and abdominal pain. She is moving her bowels once every 2-3 days which is normal for her. She has been seen at [facility] Department of surgical and medical oncology. She will be followed by them in the future. She has completed her antibiotics for an MRSA wound infection.  Exam: Midline incision closed except for the bottom portion which has a small wick of packing that was removed. This is clean with no erythema. No visible pus draining from wound. Sore to palpate. Abdomen is sore to palpate. No peritonitis.  1. Colon, segmental resection for tumor, Right colon w/appendix - APPENDICIAL MUCINOUS ADENOCARCINOMA, HIGH GRADE. - TUMOR INVADES TERMINAL ILEUM. - DISTAL AND PROXIMAL MARGINS NEGATIVE FOR CARCINOMA. - TWO OF FOURTEEN LYMPH NODES POSITIVE FOR METASTATIC CARCINOMA (2/14). - SEE ONCOLOGY REPORT. 2. Colon, segmental resection for tumor, Hepatic fixture - INVOLVEMENT BY MUCINOUS CARCINOMA PERITONEI, HIGH GRADE. - RESECTION MARGINS UNINVOLVED BY CARCINOMA. - ONE LYMPH NODE NEGATIVE FOR CARCINOMA (0/1). Microscopic Comment 1. APPENDIX: Specimen: Right colon with appendix Procedure: Right segmental resection and appendix Specimen Integrity: Intact Specimen Size: 11.3 cm appendix, 20.4 cm ileum, 15.3 cm colon Tumor Site: Appendix Tumor Size: 11.3 cm Histologic Type: Mucinous adenocarcinoma Histologic Grade: 2 Grade 1 (well differentiated) Grade 2 (moderately differentiated) Grade 3 (poorly differentiated) Grade 4 (undifferentiated) Microscopic Tumor Extension: Tumor invades adjacent ileum via serosa Margins: Proximal Margin: Negative Mesenteric Margin: Negative If all margins uninvolved - Distance of tumor from  closest margin:11.3 cm. Specific margin: proximal Adenoma present at proximal margin: No Lymph-Vascular Invasion: Present Perineural Invasion: Absent 1 of 3 FINAL for TOPEKA, GIAMMONA ([id number]) Microscopic Comment(continued) Peritumoral Nodules (tumor deposits): Present. Lymph nodes: number examined 14; number positive: 2 TNM: pT4b, pN1     Impression: Status post right hemicolectomy for T4 B. N1MX mucinous adenocarcinoma of appendix with postop wound infection improved  Plan: return 1 mont.  Stop packing in 1 week.  Continue follow up at [facility].

## 2013-04-30 DIAGNOSIS — E039 Hypothyroidism, unspecified: Secondary | ICD-10-CM

## 2013-04-30 HISTORY — DX: Hypothyroidism, unspecified: E03.9

## 2013-05-04 ENCOUNTER — Telehealth: Payer: Self-pay | Admitting: Oncology

## 2013-05-04 NOTE — Telephone Encounter (Signed)
pt called to be r/s with Dr Edwyna Shell former provider Done

## 2013-05-08 ENCOUNTER — Other Ambulatory Visit: Payer: 59

## 2013-05-08 ENCOUNTER — Ambulatory Visit: Payer: 59

## 2013-05-18 ENCOUNTER — Encounter (INDEPENDENT_AMBULATORY_CARE_PROVIDER_SITE_OTHER): Payer: Self-pay | Admitting: Surgery

## 2013-05-18 ENCOUNTER — Ambulatory Visit (INDEPENDENT_AMBULATORY_CARE_PROVIDER_SITE_OTHER): Payer: 59 | Admitting: Surgery

## 2013-05-18 VITALS — BP 136/90 | HR 64 | Temp 97.9°F | Resp 14 | Ht 67.0 in | Wt 187.8 lb

## 2013-05-18 DIAGNOSIS — Z9889 Other specified postprocedural states: Secondary | ICD-10-CM

## 2013-05-18 NOTE — Patient Instructions (Signed)
CONTINUE FOLLOW UP AT [facility].

## 2013-05-18 NOTE — Progress Notes (Signed)
Patient returns for wound check. 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 She developed wound infection this was opened and packed. She still has pain around the incision and abdominal pain. She is moving her bowels once every 2-3 days which is normal for her. She has been seen at [facility] Department of surgical and medical oncology. She will be followed by them in the future. She has completed her antibiotics for an MRSA wound infection.  Exam: Midline incision closed except for the bottom portion which has a small wick of packing that was removed. This is clean with no erythema. No visible pus draining from wound. Sore to palpate. Abdomen is sore to palpate. No peritonitis.  1. Colon, segmental resection for tumor, Right colon w/appendix - APPENDICIAL MUCINOUS ADENOCARCINOMA, HIGH GRADE. - TUMOR INVADES TERMINAL ILEUM. - DISTAL AND PROXIMAL MARGINS NEGATIVE FOR CARCINOMA. - TWO OF FOURTEEN LYMPH NODES POSITIVE FOR METASTATIC CARCINOMA (2/14). - SEE ONCOLOGY REPORT. 2. Colon, segmental resection for tumor, Hepatic fixture - INVOLVEMENT BY MUCINOUS CARCINOMA PERITONEI, HIGH GRADE. - RESECTION MARGINS UNINVOLVED BY CARCINOMA. - ONE LYMPH NODE NEGATIVE FOR CARCINOMA (0/1). Microscopic Comment 1. APPENDIX: Specimen: Right colon with appendix Procedure: Right segmental resection and appendix Specimen Integrity: Intact Specimen Size: 11.3 cm appendix, 20.4 cm ileum, 15.3 cm colon Tumor Site: Appendix Tumor Size: 11.3 cm Histologic Type: Mucinous adenocarcinoma Histologic Grade: 2 Grade 1 (well differentiated) Grade 2 (moderately differentiated) Grade 3 (poorly differentiated) Grade 4 (undifferentiated) Microscopic Tumor Extension: Tumor invades adjacent ileum via serosa Margins: Proximal Margin: Negative Mesenteric Margin: Negative If all margins uninvolved - Distance of tumor from  closest margin:11.3 cm. Specific margin: proximal Adenoma present at proximal margin: No Lymph-Vascular Invasion: Present Perineural Invasion: Absent 1 of 3 FINAL for [name], [name] ([id number]) Microscopic Comment(continued) Peritumoral Nodules (tumor deposits): Present. 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  RETURN HERE AS NEEDED.

## 2013-05-29 ENCOUNTER — Other Ambulatory Visit: Payer: Self-pay

## 2013-05-29 DIAGNOSIS — Z853 Personal history of malignant neoplasm of breast: Secondary | ICD-10-CM

## 2013-05-30 ENCOUNTER — Other Ambulatory Visit: Payer: Self-pay | Admitting: Oncology

## 2013-06-01 ENCOUNTER — Encounter: Payer: Self-pay | Admitting: Oncology

## 2013-06-01 ENCOUNTER — Ambulatory Visit (HOSPITAL_BASED_OUTPATIENT_CLINIC_OR_DEPARTMENT_OTHER): Payer: 59 | Admitting: Oncology

## 2013-06-01 ENCOUNTER — Other Ambulatory Visit (HOSPITAL_BASED_OUTPATIENT_CLINIC_OR_DEPARTMENT_OTHER): Payer: 59

## 2013-06-01 ENCOUNTER — Telehealth: Payer: Self-pay | Admitting: *Deleted

## 2013-06-01 VITALS — BP 125/85 | HR 90 | Temp 98.0°F | Ht 66.5 in | Wt 187.0 lb

## 2013-06-01 DIAGNOSIS — Z1231 Encounter for screening mammogram for malignant neoplasm of breast: Secondary | ICD-10-CM

## 2013-06-01 DIAGNOSIS — Z853 Personal history of malignant neoplasm of breast: Secondary | ICD-10-CM

## 2013-06-01 DIAGNOSIS — C181 Malignant neoplasm of appendix: Secondary | ICD-10-CM

## 2013-06-01 DIAGNOSIS — Z803 Family history of malignant neoplasm of breast: Secondary | ICD-10-CM

## 2013-06-01 LAB — CBC WITH DIFFERENTIAL/PLATELET
BASO%: 0.7 % (ref 0.0–2.0)
Basophils Absolute: 0 10*3/uL (ref 0.0–0.1)
EOS ABS: 0.1 10*3/uL (ref 0.0–0.5)
EOS%: 2.1 % (ref 0.0–7.0)
HCT: 33.7 % — ABNORMAL LOW (ref 34.8–46.6)
HGB: 11 g/dL — ABNORMAL LOW (ref 11.6–15.9)
LYMPH%: 27.7 % (ref 14.0–49.7)
MCH: 26.9 pg (ref 25.1–34.0)
MCHC: 32.7 g/dL (ref 31.5–36.0)
MCV: 82.2 fL (ref 79.5–101.0)
MONO#: 0.3 10*3/uL (ref 0.1–0.9)
MONO%: 7.6 % (ref 0.0–14.0)
NEUT%: 61.9 % (ref 38.4–76.8)
NEUTROS ABS: 2.4 10*3/uL (ref 1.5–6.5)
Platelets: 106 10*3/uL — ABNORMAL LOW (ref 145–400)
RBC: 4.1 10*6/uL (ref 3.70–5.45)
RDW: 17.5 % — AB (ref 11.2–14.5)
WBC: 3.8 10*3/uL — ABNORMAL LOW (ref 3.9–10.3)
lymph#: 1.1 10*3/uL (ref 0.9–3.3)

## 2013-06-01 LAB — COMPREHENSIVE METABOLIC PANEL (CC13)
ALBUMIN: 3.5 g/dL (ref 3.5–5.0)
ALK PHOS: 128 U/L (ref 40–150)
ALT: 88 U/L — ABNORMAL HIGH (ref 0–55)
AST: 40 U/L — AB (ref 5–34)
Anion Gap: 9 mEq/L (ref 3–11)
BUN: 12.2 mg/dL (ref 7.0–26.0)
CO2: 26 mEq/L (ref 22–29)
Calcium: 10.2 mg/dL (ref 8.4–10.4)
Chloride: 104 mEq/L (ref 98–109)
Creatinine: 0.8 mg/dL (ref 0.6–1.1)
Glucose: 107 mg/dl (ref 70–140)
POTASSIUM: 3.9 meq/L (ref 3.5–5.1)
Sodium: 140 mEq/L (ref 136–145)
TOTAL PROTEIN: 7.3 g/dL (ref 6.4–8.3)
Total Bilirubin: 0.42 mg/dL (ref 0.20–1.20)

## 2013-06-01 NOTE — Telephone Encounter (Signed)
Pt is aware that we will call w/ an appt for LL due to her template for 05/07/47 is not available. Pt is aware that i fax the order for her mammogram and they will call with her appt...td
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
## 2013-06-03 ENCOUNTER — Other Ambulatory Visit: Payer: Self-pay | Admitting: Oncology

## 2013-06-03 DIAGNOSIS — Z9889 Other specified postprocedural states: Secondary | ICD-10-CM

## 2013-06-03 DIAGNOSIS — Z853 Personal history of malignant neoplasm of breast: Secondary | ICD-10-CM

## 2013-06-03 DIAGNOSIS — Z1231 Encounter for screening mammogram for malignant neoplasm of breast: Secondary | ICD-10-CM

## 2013-08-06 HISTORY — PX: OTHER SURGICAL HISTORY: SHX169

## 2013-08-14 ENCOUNTER — Ambulatory Visit: Payer: 59

## 2013-09-11 ENCOUNTER — Encounter: Payer: 59 | Admitting: Gynecology

## 2014-01-06 ENCOUNTER — Ambulatory Visit
Admission: RE | Admit: 2014-01-06 | Discharge: 2014-01-06 | Disposition: A | Discharge status: Home / Self Care | Payer: 59 | Source: Ambulatory Visit | Attending: Oncology | Admitting: Oncology

## 2014-01-06 DIAGNOSIS — Z9889 Other specified postprocedural states: Secondary | ICD-10-CM

## 2014-01-06 DIAGNOSIS — Z853 Personal history of malignant neoplasm of breast: Secondary | ICD-10-CM

## 2014-01-06 DIAGNOSIS — Z1231 Encounter for screening mammogram for malignant neoplasm of breast: Secondary | ICD-10-CM

## 2014-01-11 ENCOUNTER — Encounter: Payer: 59 | Admitting: Gynecology

## 2014-01-19 ENCOUNTER — Other Ambulatory Visit (HOSPITAL_COMMUNITY)
Admission: RE | Admit: 2014-01-19 | Discharge: 2014-01-19 | Disposition: A | Discharge status: Home / Self Care | Payer: 59 | Source: Ambulatory Visit | Attending: Gynecology | Admitting: Gynecology

## 2014-01-19 ENCOUNTER — Encounter: Payer: Self-pay | Admitting: Gynecology

## 2014-01-19 ENCOUNTER — Ambulatory Visit (INDEPENDENT_AMBULATORY_CARE_PROVIDER_SITE_OTHER): Payer: 59 | Admitting: Gynecology

## 2014-01-19 VITALS — BP 124/76 | Ht 68.0 in | Wt 184.0 lb

## 2014-01-19 DIAGNOSIS — M8580 Other specified disorders of bone density and structure, unspecified site: Secondary | ICD-10-CM

## 2014-01-19 DIAGNOSIS — M899 Disorder of bone, unspecified: Secondary | ICD-10-CM

## 2014-01-19 DIAGNOSIS — Z01419 Encounter for gynecological examination (general) (routine) without abnormal findings: Secondary | ICD-10-CM

## 2014-01-19 DIAGNOSIS — M949 Disorder of cartilage, unspecified: Secondary | ICD-10-CM

## 2014-01-19 NOTE — Patient Instructions (Signed)
You may obtain a copy of any labs that were done today by logging onto MyChart as outlined in the instructions provided with your AVS (after visit summary). The office will not call with normal lab results but certainly if there are any significant abnormalities then we will contact you.   Health Maintenance, Female A healthy lifestyle and preventative care can promote health and wellness.  Maintain regular health, dental, and eye exams.  Eat a healthy diet. Foods like vegetables, fruits, whole grains, low-fat dairy products, and lean protein foods contain the nutrients you need without too many calories. Decrease your intake of foods high in solid fats, added sugars, and salt. Get information about a proper diet from your caregiver, if necessary.  Regular physical exercise is one of the most important things you can do for your health. Most adults should get at least 150 minutes of moderate-intensity exercise (any activity that increases your heart rate and causes you to sweat) each week. In addition, most adults need muscle-strengthening exercises on 2 or more days a week.   Maintain a healthy weight. The body mass index (BMI) is a screening tool to identify possible weight problems. It provides an estimate of body fat based on height and weight. Your caregiver can help determine your BMI, and can help you achieve or maintain a healthy weight. For adults 20 years and older:  A BMI below 18.5 is considered underweight.  A BMI of 18.5 to 24.9 is normal.  A BMI of 25 to 29.9 is considered overweight.  A BMI of 30 and above is considered obese.  Maintain normal blood lipids and cholesterol by exercising and minimizing your intake of saturated fat. Eat a balanced diet with plenty of fruits and vegetables. Blood tests for lipids and cholesterol should begin at age 61 and be repeated every 5 years. If your lipid or cholesterol levels are high, you are over 50, or you are a high risk for heart  disease, you may need your cholesterol levels checked more frequently.Ongoing high lipid and cholesterol levels should be treated with medicines if diet and exercise are not effective.  If you smoke, find out from your caregiver how to quit. If you do not use tobacco, do not start.  Lung cancer screening is recommended for adults aged 33 80 years who are at high risk for developing lung cancer because of a history of smoking. Yearly low-dose computed tomography (CT) is recommended for people who have at least a 30-pack-year history of smoking and are a current smoker or have quit within the past 15 years. A pack year of smoking is smoking an average of 1 pack of cigarettes a day for 1 year (for example: 1 pack a day for 30 years or 2 packs a day for 15 years). Yearly screening should continue until the smoker has stopped smoking for at least 15 years. Yearly screening should also be stopped for people who develop a health problem that would prevent them from having lung cancer treatment.  If you are pregnant, do not drink alcohol. If you are breastfeeding, be very cautious about drinking alcohol. If you are not pregnant and choose to drink alcohol, do not exceed 1 drink per day. One drink is considered to be 12 ounces (355 mL) of beer, 5 ounces (148 mL) of wine, or 1.5 ounces (44 mL) of liquor.  Avoid use of street drugs. Do not share needles with anyone. Ask for help if you need support or instructions about stopping  the use of drugs.  High blood pressure causes heart disease and increases the risk of stroke. Blood pressure should be checked at least every 1 to 2 years. Ongoing high blood pressure should be treated with medicines, if weight loss and exercise are not effective.  If you are 59 to 53 years old, ask your caregiver if you should take aspirin to prevent strokes.  Diabetes screening involves taking a blood sample to check your fasting blood sugar level. This should be done once every 3  years, after age 91, if you are within normal weight and without risk factors for diabetes. Testing should be considered at a younger age or be carried out more frequently if you are overweight and have at least 1 risk factor for diabetes.  Breast cancer screening is essential preventative care for women. You should practice "breast self-awareness." This means understanding the normal appearance and feel of your breasts and may include breast self-examination. Any changes detected, no matter how small, should be reported to a caregiver. Women in their 66s and 30s should have a clinical breast exam (CBE) by a caregiver as part of a regular health exam every 1 to 3 years. After age 101, women should have a CBE every year. Starting at age 100, women should consider having a mammogram (breast X-ray) every year. Women who have a family history of breast cancer should talk to their caregiver about genetic screening. Women at a high risk of breast cancer should talk to their caregiver about having an MRI and a mammogram every year.  Breast cancer gene (BRCA)-related cancer risk assessment is recommended for women who have family members with BRCA-related cancers. BRCA-related cancers include breast, ovarian, tubal, and peritoneal cancers. Having family members with these cancers may be associated with an increased risk for harmful changes (mutations) in the breast cancer genes BRCA1 and BRCA2. Results of the assessment will determine the need for genetic counseling and BRCA1 and BRCA2 testing.  The Pap test is a screening test for cervical cancer. Women should have a Pap test starting at age 57. Between ages 25 and 35, Pap tests should be repeated every 2 years. Beginning at age 37, you should have a Pap test every 3 years as long as the past 3 Pap tests have been normal. If you had a hysterectomy for a problem that was not cancer or a condition that could lead to cancer, then you no longer need Pap tests. If you are  between ages 50 and 76, and you have had normal Pap tests going back 10 years, you no longer need Pap tests. If you have had past treatment for cervical cancer or a condition that could lead to cancer, you need Pap tests and screening for cancer for at least 20 years after your treatment. If Pap tests have been discontinued, risk factors (such as a new sexual partner) need to be reassessed to determine if screening should be resumed. Some women have medical problems that increase the chance of getting cervical cancer. In these cases, your caregiver may recommend more frequent screening and Pap tests.  The human papillomavirus (HPV) test is an additional test that may be used for cervical cancer screening. The HPV test looks for the virus that can cause the cell changes on the cervix. The cells collected during the Pap test can be tested for HPV. The HPV test could be used to screen women aged 44 years and older, and should be used in women of any age  who have unclear Pap test results. After the age of 55, women should have HPV testing at the same frequency as a Pap test.  Colorectal cancer can be detected and often prevented. Most routine colorectal cancer screening begins at the age of 44 and continues through age 20. However, your caregiver may recommend screening at an earlier age if you have risk factors for colon cancer. On a yearly basis, your caregiver may provide home test kits to check for hidden blood in the stool. Use of a small camera at the end of a tube, to directly examine the colon (sigmoidoscopy or colonoscopy), can detect the earliest forms of colorectal cancer. Talk to your caregiver about this at age 86, when routine screening begins. Direct examination of the colon should be repeated every 5 to 10 years through age 13, unless early forms of pre-cancerous polyps or small growths are found.  Hepatitis C blood testing is recommended for all people born from [birth year] through 1965 and any  individual with known risks for hepatitis C.  Practice safe sex. Use condoms and avoid high-risk sexual practices to reduce the spread of sexually transmitted infections (STIs). Sexually active women aged 36 and younger should be checked for Chlamydia, which is a common sexually transmitted infection. Older women with new or multiple partners should also be tested for Chlamydia. Testing for other STIs is recommended if you are sexually active and at increased risk.  Osteoporosis is a disease in which the bones lose minerals and strength with aging. This can result in serious bone fractures. The risk of osteoporosis can be identified using a bone density scan. Women ages 20 and over and women at risk for fractures or osteoporosis should discuss screening with their caregivers. Ask your caregiver whether you should be taking a calcium supplement or vitamin D to reduce the rate of osteoporosis.  Menopause can be associated with physical symptoms and risks. Hormone replacement therapy is available to decrease symptoms and risks. You should talk to your caregiver about whether hormone replacement therapy is right for you.  Use sunscreen. Apply sunscreen liberally and repeatedly throughout the day. You should seek shade when your shadow is shorter than you. Protect yourself by wearing long sleeves, pants, a wide-brimmed hat, and sunglasses year round, whenever you are outdoors.  Notify your caregiver of new moles or changes in moles, especially if there is a change in shape or color. Also notify your caregiver if a mole is larger than the size of a pencil eraser.  Stay current with your immunizations. Document Released: 10/30/2010 Document Revised: 08/11/2012 Document Reviewed: 10/30/2010 [facility] Patient Information 2014 [employer].

## 2014-01-19 NOTE — Addendum Note (Signed)
Addended by: Nelva Nay on: 01/19/2014 11:06 AM   Modules accepted: Orders

## 2014-01-19 NOTE — Progress Notes (Signed)
[name] 05-03-1960 [id number]        53 y.o.  G0P0 for annual exam.  Several issues noted below.  Past medical history,surgical history, problem list, medications, allergies, family history and social history were all reviewed and documented as reviewed in the EPIC chart.  ROS:  12 system ROS performed with pertinent positives and negatives included in the history, assessment and plan.   Additional significant findings :  none   Exam: [name] Vitals:   01/19/14 1014  BP: 124/76  Height: 5\' 8"  (1.727 m)  Weight: 184 lb (83.462 kg)   General appearance:  Normal affect, orientation and appearance. Skin: Grossly normal HEENT: Without gross lesions.  No cervical or supraclavicular adenopathy. Thyroid normal.  Lungs:  Clear without wheezing, rales or rhonchi Cardiac: RR, without RMG Abdominal:  Soft, nontender, without masses, guarding, rebound, organomegaly or hernia Breasts:  Examined lying and sitting without masses, retractions, discharge or axillary adenopathy.  Well-healed left lumpectomy scar Pelvic:  Ext/BUS/vagina with atrophic changes  Cervix flush with the upper vagina  Adnexa  Without masses or tenderness    Anus and perineum  Normal   Rectovaginal  Normal sphincter tone without palpated masses or tenderness.    Assessment/Plan:  53 y.o. G0P0 female for annual exam.   1. Postmenopausal/atrophic genital changes. Status post supracervical hysterectomy BSO.  Stop her Estring last year. Is doing well without significant symptoms of hot flushes, night sweats or vaginal dryness. No vaginal bleeding. Continue to monitor. 2. 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 Initial resection with recurrence. Follow up debulking surgery with chemotherapy.  Currently NED. Actively being followed. 3. Osteopenia. Prior DEXAs Dr. Josie Dixon office. No copies of these in her chart. Will get baseline DEXA now. Increase calcium vitamin D reviewed. 4. Breast cancer 2003.  Mammogram  12/2013. Continue with annual mammography. Exam NED.  SBE monthly reviewed. 5. Pap smear 2013. Pap smear done today. No history of abnormal Pap smears previously. 6. Colonoscopy 2014. 7. Health maintenance. No routine blood work done as this is done at Dr. Rosalyn Gess office. Follow up for DEXA otherwise one year, sooner as needed.     Anastasio Auerbach MD, 10:46 AM 01/19/2014

## 2014-01-20 LAB — CYTOLOGY - PAP

## 2014-02-13 ENCOUNTER — Telehealth: Payer: Self-pay | Admitting: Oncology

## 2014-02-13 NOTE — Telephone Encounter (Signed)
, °

## 2014-02-18 ENCOUNTER — Ambulatory Visit (INDEPENDENT_AMBULATORY_CARE_PROVIDER_SITE_OTHER): Payer: 59

## 2014-02-18 DIAGNOSIS — M8580 Other specified disorders of bone density and structure, unspecified site: Secondary | ICD-10-CM

## 2014-02-19 ENCOUNTER — Encounter: Payer: Self-pay | Admitting: Gynecology

## 2014-02-19 ENCOUNTER — Telehealth: Payer: Self-pay | Admitting: Gynecology

## 2014-02-19 DIAGNOSIS — M8580 Other specified disorders of bone density and structure, unspecified site: Secondary | ICD-10-CM

## 2014-02-19 NOTE — Telephone Encounter (Signed)
Pt informed with the below note, order placed ,pt will be in on 02/24/14 @ 9:30 am to have labs done. Pt will make consult appointment once labs are done

## 2014-02-19 NOTE — Telephone Encounter (Signed)
Tell patient her bone density shows osteopenia. Almost to the level of osteoporosis. I would like her to check a vitamin D comprehensive metabolic panel TSH and PTH. Also requests copy of her last bone density from Dr. Ubaldo Glassing office. Schedule appointment to see me to discuss these results in one month which should give [place] time to get the bone density copy.

## 2014-02-24 ENCOUNTER — Other Ambulatory Visit: Payer: Self-pay | Admitting: Gynecology

## 2014-02-24 ENCOUNTER — Telehealth: Payer: Self-pay | Admitting: *Deleted

## 2014-02-24 ENCOUNTER — Other Ambulatory Visit: Payer: 59

## 2014-02-24 DIAGNOSIS — Z853 Personal history of malignant neoplasm of breast: Secondary | ICD-10-CM

## 2014-02-24 DIAGNOSIS — M8580 Other specified disorders of bone density and structure, unspecified site: Secondary | ICD-10-CM

## 2014-02-24 LAB — TSH: TSH: 6.501 u[IU]/mL — AB (ref 0.350–4.500)

## 2014-02-24 NOTE — Telephone Encounter (Signed)
Pt called and left VM stating that she was at Dr. Zelphia Cairo office this morning for labs and they accidentally cancelled Dr. Mariana Kaufman orders for labs in 05/2014. Called pt back and let her know we would make sure they were added back in. Message passed to Dr. Marko Plume so she can order correct labs.

## 2014-02-25 ENCOUNTER — Other Ambulatory Visit: Payer: Self-pay | Admitting: Gynecology

## 2014-02-25 DIAGNOSIS — R7989 Other specified abnormal findings of blood chemistry: Secondary | ICD-10-CM

## 2014-02-25 LAB — PARATHYROID HORMONE, INTACT (NO CA): PTH: 60 pg/mL (ref 14–64)

## 2014-02-26 ENCOUNTER — Other Ambulatory Visit: Payer: 59

## 2014-02-26 DIAGNOSIS — R7989 Other specified abnormal findings of blood chemistry: Secondary | ICD-10-CM

## 2014-02-26 LAB — THYROID PANEL WITH TSH
FREE THYROXINE INDEX: 1.9 (ref 1.4–3.8)
T3 UPTAKE: 27 % (ref 22–35)
T4 TOTAL: 7.1 ug/dL (ref 4.5–12.0)
TSH: 8.555 u[IU]/mL — AB (ref 0.350–4.500)

## 2014-02-28 ENCOUNTER — Other Ambulatory Visit: Payer: Self-pay | Admitting: Oncology

## 2014-02-28 DIAGNOSIS — Z853 Personal history of malignant neoplasm of breast: Secondary | ICD-10-CM

## 2014-03-05 ENCOUNTER — Ambulatory Visit (INDEPENDENT_AMBULATORY_CARE_PROVIDER_SITE_OTHER): Payer: 59 | Admitting: Gynecology

## 2014-03-05 ENCOUNTER — Encounter: Payer: Self-pay | Admitting: Gynecology

## 2014-03-05 DIAGNOSIS — M8580 Other specified disorders of bone density and structure, unspecified site: Secondary | ICD-10-CM

## 2014-03-05 DIAGNOSIS — E038 Other specified hypothyroidism: Secondary | ICD-10-CM

## 2014-03-05 NOTE — Progress Notes (Signed)
[name] 01/15/1961 [id number]        53 y.o.  G0P0 presents with her husband to discuss 2 issues. The first is her recent bone density showing osteopenia in the second is her recent thyroid checks showed her TSH mildly elevated at 6. 5 repeat panel showed normal free thyroxine index and T3 uptake and T4 total but her TSH again remained elevated at 8.5.  Past medical history,surgical history, problem list, medications, allergies, family history and social history were all reviewed and documented in the EPIC chart.  Directed ROS with pertinent positives and negatives documented in the history of present illness/assessment and plan.  Exam: General appearance:  Normal HEENT normal with thyroid appearance and palpation normal.  Assessment/Plan:  53 y.o. G0P0 with:  1. Elevated TSH consistent with hypothyroid. Does note some fatigue but has been this way for a long time following her chemotherapy. No acute changes such as hair skin weight. Options to include thyroid replacement versus discussion with the internist/endocrinologist reviewed. The patient would prefer to talk to her primary Dr. Osborne Casco and decide a treatment regimen from there. I gave her copies of her thyroid labs to take with her. 2. Osteopenia. DEXA shows T score -2.4 AP spine. PTH was normal. Reported vitamin D level normal through Dr. Loren Racer office. FRAX 6.5%/0.7%.  History of chemotherapy as well as Arimidex in the past. Was told she was osteopenic by Dr. Ubaldo Glassing although I do not have copies of these bone densities.  Options for treatment now given the low T score regardless of the FRAX versus observation, repeat DEXA in 2 years and then decision about treatment then. Increasing weightbearing exercise maximizing calcium/vitamin D all reviewed. Patient very reluctant to consider medication and would prefer observation at this point. We'll plan on repeat DEXA at 2 years and then we'll go from there.     Anastasio Auerbach MD, 4:52  PM 03/05/2014

## 2014-03-05 NOTE — Patient Instructions (Signed)
Follow up with Dr. Osborne Casco to discuss your thyroid We will plan on repeating your bone density in 2 years.

## 2014-04-03 ENCOUNTER — Telehealth: Payer: Self-pay | Admitting: Oncology

## 2014-04-03 NOTE — Telephone Encounter (Signed)
s.w. pt and advised on d.t change per MD request....pt ok and aware

## 2014-06-06 ENCOUNTER — Other Ambulatory Visit: Payer: Self-pay | Admitting: Oncology

## 2014-06-09 ENCOUNTER — Other Ambulatory Visit: Payer: 59

## 2014-06-09 ENCOUNTER — Ambulatory Visit: Payer: 59 | Admitting: Oncology

## 2014-06-10 ENCOUNTER — Telehealth: Payer: Self-pay | Admitting: Oncology

## 2014-06-10 ENCOUNTER — Other Ambulatory Visit (HOSPITAL_BASED_OUTPATIENT_CLINIC_OR_DEPARTMENT_OTHER): Payer: 59

## 2014-06-10 ENCOUNTER — Encounter: Payer: Self-pay | Admitting: Oncology

## 2014-06-10 ENCOUNTER — Ambulatory Visit (HOSPITAL_BASED_OUTPATIENT_CLINIC_OR_DEPARTMENT_OTHER): Payer: 59 | Admitting: Oncology

## 2014-06-10 VITALS — BP 119/70 | HR 76 | Temp 97.2°F | Resp 18 | Ht 68.0 in | Wt 200.5 lb

## 2014-06-10 DIAGNOSIS — Z1231 Encounter for screening mammogram for malignant neoplasm of breast: Secondary | ICD-10-CM

## 2014-06-10 DIAGNOSIS — C181 Malignant neoplasm of appendix: Secondary | ICD-10-CM

## 2014-06-10 DIAGNOSIS — Z853 Personal history of malignant neoplasm of breast: Secondary | ICD-10-CM

## 2014-06-10 DIAGNOSIS — Z86718 Personal history of other venous thrombosis and embolism: Secondary | ICD-10-CM

## 2014-06-10 LAB — COMPREHENSIVE METABOLIC PANEL (CC13)
ALBUMIN: 3.8 g/dL (ref 3.5–5.0)
ALT: 18 U/L (ref 0–55)
ANION GAP: 10 meq/L (ref 3–11)
AST: 21 U/L (ref 5–34)
Alkaline Phosphatase: 93 U/L (ref 40–150)
BUN: 16.3 mg/dL (ref 7.0–26.0)
CALCIUM: 9.8 mg/dL (ref 8.4–10.4)
CHLORIDE: 106 meq/L (ref 98–109)
CO2: 25 mEq/L (ref 22–29)
CREATININE: 0.8 mg/dL (ref 0.6–1.1)
EGFR: 82 mL/min/{1.73_m2} — ABNORMAL LOW (ref >90)
Glucose: 99 mg/dl (ref 70–140)
POTASSIUM: 3.9 meq/L (ref 3.5–5.1)
Sodium: 141 mEq/L (ref 136–145)
Total Bilirubin: 0.36 mg/dL (ref 0.20–1.20)
Total Protein: 7.5 g/dL (ref 6.4–8.3)

## 2014-06-10 LAB — CBC WITH DIFFERENTIAL/PLATELET
BASO%: 1.4 % (ref 0.0–2.0)
Basophils Absolute: 0.1 10*3/uL (ref 0.0–0.1)
EOS%: 4.7 % (ref 0.0–7.0)
Eosinophils Absolute: 0.2 10*3/uL (ref 0.0–0.5)
HEMATOCRIT: 43.4 % (ref 34.8–46.6)
HGB: 13.8 g/dL (ref 11.6–15.9)
LYMPH%: 42.5 % (ref 14.0–49.7)
MCH: 29 pg (ref 25.1–34.0)
MCHC: 31.8 g/dL (ref 31.5–36.0)
MCV: 91.2 fL (ref 79.5–101.0)
MONO#: 0.5 10*3/uL (ref 0.1–0.9)
MONO%: 9.7 % (ref 0.0–14.0)
NEUT#: 2.2 10*3/uL (ref 1.5–6.5)
NEUT%: 41.7 % (ref 38.4–76.8)
PLATELETS: 317 10*3/uL (ref 145–400)
RBC: 4.76 10*6/uL (ref 3.70–5.45)
RDW: 16.6 % — ABNORMAL HIGH (ref 11.2–14.5)
WBC: 5.2 10*3/uL (ref 3.9–10.3)
lymph#: 2.2 10*3/uL (ref 0.9–3.3)

## 2014-06-10 NOTE — Telephone Encounter (Signed)
per pof to sch pt appt-LL sch not opened/printed & gave to Webb Silversmith M-sch pt mamma-cld pt & left a message of time & date of mamma
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
## 2015-01-10 ENCOUNTER — Ambulatory Visit
Admission: RE | Admit: 2015-01-10 | Discharge: 2015-01-10 | Disposition: A | Discharge status: Home / Self Care | Payer: 59 | Source: Ambulatory Visit | Attending: Oncology | Admitting: Oncology

## 2015-01-10 DIAGNOSIS — Z1231 Encounter for screening mammogram for malignant neoplasm of breast: Secondary | ICD-10-CM

## 2015-01-21 ENCOUNTER — Encounter: Payer: Self-pay | Admitting: Gynecology

## 2015-01-21 ENCOUNTER — Ambulatory Visit (INDEPENDENT_AMBULATORY_CARE_PROVIDER_SITE_OTHER): Payer: 59 | Admitting: Gynecology

## 2015-01-21 VITALS — BP 124/78 | Ht 68.0 in | Wt 204.0 lb

## 2015-01-21 DIAGNOSIS — Z01419 Encounter for gynecological examination (general) (routine) without abnormal findings: Secondary | ICD-10-CM

## 2015-01-21 DIAGNOSIS — N952 Postmenopausal atrophic vaginitis: Secondary | ICD-10-CM

## 2015-01-21 DIAGNOSIS — M8580 Other specified disorders of bone density and structure, unspecified site: Secondary | ICD-10-CM | POA: Diagnosis not present

## 2015-01-21 NOTE — Patient Instructions (Signed)

## 2015-01-21 NOTE — Progress Notes (Signed)
[name] 1960/05/17 [id number]        54 y.o.  G0P0 for annual exam.  Doing well.  Past medical history,surgical history, problem list, medications, allergies, family history and social history were all reviewed and documented as reviewed in the EPIC chart.  ROS:  Performed with pertinent positives and negatives included in the history, assessment and plan.   Additional significant findings :  none   Exam: [name] Vitals:   01/21/15 0953  BP: 124/78  Height: 5\' 8"  (1.727 m)  Weight: 204 lb (92.534 kg)   General appearance:  Normal affect, orientation and appearance. Skin: Grossly normal HEENT: Without gross lesions.  No cervical or supraclavicular adenopathy. Thyroid normal.  Lungs:  Clear without wheezing, rales or rhonchi Cardiac: RR, without RMG Abdominal:  Soft, nontender, without masses, guarding, rebound, organomegaly or hernia Breasts:  Examined lying and sitting without masses, retractions, discharge or axillary adenopathy.  Well-healed left lumpectomy scar Pelvic:  Ext/BUS/vagina with atrophic changes.  Cervix flush with the upper vagina difficult to visualize or palpate.  Adnexa  Without masses or tenderness    Anus and perineum  Normal   Rectovaginal  Normal sphincter tone without palpated masses or tenderness.    Assessment/Plan:  54 y.o. G0P0 female for annual exam.   1. Postmenopausal/atrophic genital changes. Status post supracervical hysterectomy BSO 2008 by Dr. Ubaldo Glassing. Doing well without significant hot flushes night sweats or vaginal dryness. Continue to monitor. No vaginal bleeding. 2. Osteopenia.  DEXA 01/2014 T score -2.4 FRAX 6.5%/0.7%. Have discussed treatment options with her per 03/05/2014 note and she declined any treatment but prefers observation with repeat DEXA next year.  Recent vitamin D 37. Recommended supplementing with thousand units daily. 3. History of breast cancer. Exam NED. Mammogram 12/2014.  Continue follow up with oncologist.  SBE monthly reviewed. 4. History of appendiceal carcinoma 2014. Exam in the ED. Recent CT scan negative. Continue to follow up with the oncologist.  Colonoscopy 2014. Repeat at their recommended interval. 5. Pap smear 2015. No Pap smear done today. Plan repeat Pap smear at 3 year interval. No history of abnormal Pap smears previously. 6. Health maintenance. No routine blood work done as it has all recently been done by her primary physician. Her TSH is elevated but apparently with a normal free T4 they're following her and not treating her at this time. Follow up in one year, sooner as needed.  Anastasio Auerbach MD, 10:20 AM 01/21/2015

## 2015-01-22 LAB — URINALYSIS W MICROSCOPIC + REFLEX CULTURE
BACTERIA UA: NONE SEEN [HPF]
Bilirubin Urine: NEGATIVE
CASTS: NONE SEEN [LPF]
CRYSTALS: NONE SEEN [HPF]
Glucose, UA: NEGATIVE
Ketones, ur: NEGATIVE
Leukocytes, UA: NEGATIVE
Nitrite: NEGATIVE
RBC / HPF: NONE SEEN RBC/HPF (ref <=2)
Specific Gravity, Urine: 1.018 (ref 1.001–1.035)
YEAST: NONE SEEN [HPF]
pH: 5.5 (ref 5.0–8.0)

## 2015-01-23 LAB — URINE CULTURE: Colony Count: 5000

## 2015-04-06 ENCOUNTER — Encounter: Payer: Self-pay | Admitting: Internal Medicine

## 2015-05-17 ENCOUNTER — Telehealth: Payer: Self-pay | Admitting: Oncology

## 2015-05-17 NOTE — Telephone Encounter (Signed)
Patient called in to get scheduled for her one year visit

## 2015-05-20 LAB — PROTIME-INR: INR: 1.1 (ref 0.9–1.1)

## 2015-06-15 ENCOUNTER — Telehealth: Payer: Self-pay

## 2015-06-15 ENCOUNTER — Other Ambulatory Visit: Payer: Self-pay | Admitting: Oncology

## 2015-06-15 NOTE — Telephone Encounter (Signed)
[name] said that Dr. Crisoforo Oxford did CBC,CEA,and CA 19.9 in January.  Dr. Osborne Casco did complete labs in August 2016.  She will bring copies of lab to appointment along with a copy of the recent CT scan.  She will also bring Dr. Lyda Jester last note if she can get a copy.

## 2015-06-15 NOTE — Telephone Encounter (Signed)
-----   Message from Gordy Levan, MD sent at 06/15/2015 12:51 PM EST ----- If having labs followed at [facility], will not need lab at my visit 2-16

## 2015-06-16 ENCOUNTER — Encounter: Payer: Self-pay | Admitting: Oncology

## 2015-06-16 ENCOUNTER — Other Ambulatory Visit: Payer: 59

## 2015-06-16 ENCOUNTER — Ambulatory Visit (HOSPITAL_BASED_OUTPATIENT_CLINIC_OR_DEPARTMENT_OTHER): Payer: 59 | Admitting: Oncology

## 2015-06-16 VITALS — BP 123/80 | HR 78 | Temp 97.7°F | Resp 18 | Ht 68.0 in | Wt 212.4 lb

## 2015-06-16 DIAGNOSIS — Z90710 Acquired absence of both cervix and uterus: Secondary | ICD-10-CM

## 2015-06-16 DIAGNOSIS — Z90722 Acquired absence of ovaries, bilateral: Secondary | ICD-10-CM

## 2015-06-16 DIAGNOSIS — C181 Malignant neoplasm of appendix: Secondary | ICD-10-CM

## 2015-06-16 DIAGNOSIS — Z86718 Personal history of other venous thrombosis and embolism: Secondary | ICD-10-CM

## 2015-06-16 DIAGNOSIS — Z1239 Encounter for other screening for malignant neoplasm of breast: Secondary | ICD-10-CM

## 2015-06-16 DIAGNOSIS — Z853 Personal history of malignant neoplasm of breast: Secondary | ICD-10-CM

## 2015-06-16 DIAGNOSIS — Z9079 Acquired absence of other genital organ(s): Secondary | ICD-10-CM

## 2015-06-16 DIAGNOSIS — Z9081 Acquired absence of spleen: Secondary | ICD-10-CM

## 2015-06-16 DIAGNOSIS — Z808 Family history of malignant neoplasm of other organs or systems: Secondary | ICD-10-CM
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
## 2015-06-17 ENCOUNTER — Other Ambulatory Visit: Payer: Self-pay

## 2015-06-17 DIAGNOSIS — Z1231 Encounter for screening mammogram for malignant neoplasm of breast: Secondary | ICD-10-CM

## 2015-06-18 DIAGNOSIS — Z9079 Acquired absence of other genital organ(s): Secondary | ICD-10-CM

## 2015-06-18 DIAGNOSIS — Z9081 Acquired absence of spleen: Secondary | ICD-10-CM | POA: Insufficient documentation

## 2015-06-18 DIAGNOSIS — Z90722 Acquired absence of ovaries, bilateral: Secondary | ICD-10-CM

## 2015-06-18 DIAGNOSIS — C181 Malignant neoplasm of appendix: Secondary | ICD-10-CM | POA: Insufficient documentation

## 2015-06-18 DIAGNOSIS — Z90710 Acquired absence of both cervix and uterus: Secondary | ICD-10-CM | POA: Insufficient documentation

## 2015-11-18 DIAGNOSIS — I77819 Aortic ectasia, unspecified site: Secondary | ICD-10-CM | POA: Diagnosis not present

## 2015-11-18 DIAGNOSIS — K429 Umbilical hernia without obstruction or gangrene: Secondary | ICD-10-CM | POA: Diagnosis not present

## 2015-11-18 DIAGNOSIS — C181 Malignant neoplasm of appendix: Secondary | ICD-10-CM | POA: Diagnosis not present

## 2015-12-13 DIAGNOSIS — L309 Dermatitis, unspecified: Secondary | ICD-10-CM | POA: Diagnosis not present

## 2015-12-13 DIAGNOSIS — D690 Allergic purpura: Secondary | ICD-10-CM | POA: Diagnosis not present

## 2015-12-23 DIAGNOSIS — R8299 Other abnormal findings in urine: Secondary | ICD-10-CM | POA: Diagnosis not present

## 2015-12-23 DIAGNOSIS — M859 Disorder of bone density and structure, unspecified: Secondary | ICD-10-CM | POA: Diagnosis not present

## 2015-12-23 DIAGNOSIS — E784 Other hyperlipidemia: Secondary | ICD-10-CM | POA: Diagnosis not present

## 2015-12-23 DIAGNOSIS — R358 Other polyuria: Secondary | ICD-10-CM | POA: Diagnosis not present

## 2015-12-23 DIAGNOSIS — E038 Other specified hypothyroidism: Secondary | ICD-10-CM | POA: Diagnosis not present

## 2015-12-27 DIAGNOSIS — N281 Cyst of kidney, acquired: Secondary | ICD-10-CM | POA: Diagnosis not present

## 2015-12-27 DIAGNOSIS — D692 Other nonthrombocytopenic purpura: Secondary | ICD-10-CM | POA: Diagnosis not present

## 2015-12-27 DIAGNOSIS — N3000 Acute cystitis without hematuria: Secondary | ICD-10-CM | POA: Diagnosis not present

## 2015-12-30 DIAGNOSIS — Z6831 Body mass index (BMI) 31.0-31.9, adult: Secondary | ICD-10-CM | POA: Diagnosis not present

## 2015-12-30 DIAGNOSIS — E668 Other obesity: Secondary | ICD-10-CM | POA: Diagnosis not present

## 2015-12-30 DIAGNOSIS — C181 Malignant neoplasm of appendix: Secondary | ICD-10-CM | POA: Diagnosis not present

## 2015-12-30 DIAGNOSIS — E038 Other specified hypothyroidism: Secondary | ICD-10-CM | POA: Diagnosis not present

## 2015-12-30 DIAGNOSIS — Z1212 Encounter for screening for malignant neoplasm of rectum: Secondary | ICD-10-CM | POA: Diagnosis not present

## 2015-12-30 DIAGNOSIS — D690 Allergic purpura: Secondary | ICD-10-CM | POA: Diagnosis not present

## 2015-12-30 DIAGNOSIS — I201 Angina pectoris with documented spasm: Secondary | ICD-10-CM | POA: Diagnosis not present

## 2015-12-30 DIAGNOSIS — C50919 Malignant neoplasm of unspecified site of unspecified female breast: Secondary | ICD-10-CM | POA: Diagnosis not present

## 2015-12-30 DIAGNOSIS — Z23 Encounter for immunization: Secondary | ICD-10-CM | POA: Diagnosis not present

## 2015-12-30 DIAGNOSIS — I81 Portal vein thrombosis: Secondary | ICD-10-CM | POA: Diagnosis not present

## 2015-12-30 DIAGNOSIS — Z0000 Encounter for general adult medical examination without abnormal findings: Secondary | ICD-10-CM | POA: Diagnosis not present

## 2015-12-30 DIAGNOSIS — Z9081 Acquired absence of spleen: Secondary | ICD-10-CM | POA: Diagnosis not present

## 2015-12-30 DIAGNOSIS — J302 Other seasonal allergic rhinitis: Secondary | ICD-10-CM | POA: Diagnosis not present

## 2016-01-04 DIAGNOSIS — H5203 Hypermetropia, bilateral: Secondary | ICD-10-CM | POA: Diagnosis not present

## 2016-01-04 DIAGNOSIS — H524 Presbyopia: Secondary | ICD-10-CM | POA: Diagnosis not present

## 2016-01-13 ENCOUNTER — Other Ambulatory Visit: Payer: Self-pay | Admitting: Oncology

## 2016-01-13 ENCOUNTER — Ambulatory Visit
Admission: RE | Admit: 2016-01-13 | Discharge: 2016-01-13 | Disposition: A | Discharge status: Home / Self Care | Payer: Medicare Other | Source: Ambulatory Visit

## 2016-01-13 DIAGNOSIS — Z1231 Encounter for screening mammogram for malignant neoplasm of breast: Secondary | ICD-10-CM | POA: Diagnosis not present

## 2016-01-25 ENCOUNTER — Ambulatory Visit (INDEPENDENT_AMBULATORY_CARE_PROVIDER_SITE_OTHER): Payer: Medicare Other | Admitting: Gynecology

## 2016-01-25 ENCOUNTER — Encounter: Payer: Self-pay | Admitting: Gynecology

## 2016-01-25 VITALS — BP 140/90 | Ht 68.0 in | Wt 203.0 lb

## 2016-01-25 DIAGNOSIS — Z01411 Encounter for gynecological examination (general) (routine) with abnormal findings: Secondary | ICD-10-CM | POA: Diagnosis not present

## 2016-01-25 DIAGNOSIS — Z01419 Encounter for gynecological examination (general) (routine) without abnormal findings: Secondary | ICD-10-CM | POA: Diagnosis not present

## 2016-01-25 DIAGNOSIS — M8580 Other specified disorders of bone density and structure, unspecified site: Secondary | ICD-10-CM | POA: Diagnosis not present

## 2016-01-25 DIAGNOSIS — N952 Postmenopausal atrophic vaginitis: Secondary | ICD-10-CM

## 2016-01-25 NOTE — Patient Instructions (Signed)
Followup for bone density as scheduled. 

## 2016-01-25 NOTE — Progress Notes (Signed)
    [name] 1961/01/11 [id number]        55 y.o.  G0P0  for annual exam.  For breast and pelvic exam.  Past medical history,surgical history, problem list, medications, allergies, family history and social history were all reviewed and documented as reviewed in the EPIC chart.  ROS:  Performed with pertinent positives and negatives included in the history, assessment and plan.   Additional significant findings :  None   Exam: [name] assistant Vitals:   01/25/16 0955  BP: 140/90  Weight: 203 lb (92.1 kg)  Height: 5\' 8"  (1.727 m)   Body mass index is 30.87 kg/m.  General appearance:  Normal affect, orientation and appearance. Skin: Grossly normal HEENT: Without gross lesions.  No cervical or supraclavicular adenopathy. Thyroid normal.  Lungs:  Clear without wheezing, rales or rhonchi Cardiac: RR, without RMG Abdominal:  Soft, nontender, without masses, guarding, rebound, organomegaly or hernia Breasts:  Examined lying and sitting without masses, retractions, discharge or axillary adenopathy.Well-healed left lumpectomy scar Pelvic:  Ext, BUS, Vagina with atrophic changes  Cervix flush with upper vagina. Difficult to visualize or palpate  Adnexa without masses or tenderness    Anus and perineum normal   Rectovaginal normal sphincter tone without palpated masses or tenderness.    Assessment/Plan:  55 y.o. G0P0 female for breast and pelvic exam.   1. Postmenopausal/atrophic genital changes. Status post supracervical hysterectomy and BSO 2008 by Dr. Ubaldo Velez. No bleeding or menopausal symptoms. 2. Osteopenia. DEXA 01/2014 T score -2.4 FRAX 6.5%/0.7%. Patient again states she would not take medication. She would prefer breaking a bone. Recommend repeat DEXA now at 2 year interval to see where we stand. Patient agrees to follow up for this. 3. History of breast cancer. Exam NED. Mammography 12/2015. Continue with annual mammography when due. SBE monthly reviewed. 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 Continue to follow up with her oncologist for management. Colonoscopy 2014. 5. Pap smear 2015. No Pap smear done today. No history of abnormal Pap smears. Plan repeat Pap smear next year at 3 year interval. 6. Health maintenance. Mild elevated blood pressure. Normal at other physician's offices. Will follow up with primary physician if remains elevated. No routine lab work done as patient reports this done elsewhere. Follow up for her bone density otherwise follow up in one year, sooner as needed.    Julie Auerbach MD, 10:23 AM 01/25/2016

## 2016-02-23 DIAGNOSIS — L821 Other seborrheic keratosis: Secondary | ICD-10-CM | POA: Diagnosis not present

## 2016-02-23 DIAGNOSIS — L959 Vasculitis limited to the skin, unspecified: Secondary | ICD-10-CM | POA: Diagnosis not present

## 2016-02-29 DIAGNOSIS — M810 Age-related osteoporosis without current pathological fracture: Secondary | ICD-10-CM

## 2016-02-29 HISTORY — DX: Age-related osteoporosis without current pathological fracture: M81.0

## 2016-03-01 ENCOUNTER — Encounter: Payer: Self-pay | Admitting: Gynecology

## 2016-03-01 ENCOUNTER — Telehealth: Payer: Self-pay | Admitting: Gynecology

## 2016-03-01 ENCOUNTER — Other Ambulatory Visit: Payer: Self-pay | Admitting: Gynecology

## 2016-03-01 ENCOUNTER — Ambulatory Visit (INDEPENDENT_AMBULATORY_CARE_PROVIDER_SITE_OTHER): Payer: Medicare Other

## 2016-03-01 DIAGNOSIS — M810 Age-related osteoporosis without current pathological fracture: Secondary | ICD-10-CM | POA: Diagnosis not present

## 2016-03-01 DIAGNOSIS — M8580 Other specified disorders of bone density and structure, unspecified site: Secondary | ICD-10-CM

## 2016-03-01 NOTE — Telephone Encounter (Signed)
Left message for pt call 

## 2016-03-01 NOTE — Telephone Encounter (Signed)
Tell patient her bone density showed osteoporosis.  Basically stable from before, but does put her at an increased fracture risk.  OV if she wants to discuss treatment options.

## 2016-03-01 NOTE — Telephone Encounter (Signed)
Pt informed, declined at the time,will call back if she changes her mind.

## 2016-04-06 DIAGNOSIS — R350 Frequency of micturition: Secondary | ICD-10-CM | POA: Diagnosis not present

## 2016-04-06 DIAGNOSIS — N3000 Acute cystitis without hematuria: Secondary | ICD-10-CM | POA: Diagnosis not present

## 2016-04-06 DIAGNOSIS — R3129 Other microscopic hematuria: Secondary | ICD-10-CM | POA: Diagnosis not present

## 2016-05-01 DIAGNOSIS — R3129 Other microscopic hematuria: Secondary | ICD-10-CM | POA: Diagnosis not present

## 2016-05-25 DIAGNOSIS — Z08 Encounter for follow-up examination after completed treatment for malignant neoplasm: Secondary | ICD-10-CM | POA: Diagnosis not present

## 2016-05-25 DIAGNOSIS — C181 Malignant neoplasm of appendix: Secondary | ICD-10-CM | POA: Diagnosis not present

## 2016-05-25 DIAGNOSIS — Z85038 Personal history of other malignant neoplasm of large intestine: Secondary | ICD-10-CM | POA: Diagnosis not present

## 2016-05-25 DIAGNOSIS — C7880 Secondary malignant neoplasm of unspecified digestive organ: Secondary | ICD-10-CM | POA: Diagnosis not present

## 2016-05-29 DIAGNOSIS — N281 Cyst of kidney, acquired: Secondary | ICD-10-CM | POA: Diagnosis not present

## 2016-05-29 DIAGNOSIS — R3121 Asymptomatic microscopic hematuria: Secondary | ICD-10-CM | POA: Diagnosis not present

## 2016-09-11 DIAGNOSIS — N3020 Other chronic cystitis without hematuria: Secondary | ICD-10-CM | POA: Diagnosis not present

## 2016-11-23 DIAGNOSIS — C786 Secondary malignant neoplasm of retroperitoneum and peritoneum: Secondary | ICD-10-CM | POA: Diagnosis not present

## 2016-11-23 DIAGNOSIS — C181 Malignant neoplasm of appendix: Secondary | ICD-10-CM | POA: Diagnosis not present

## 2016-11-23 DIAGNOSIS — R978 Other abnormal tumor markers: Secondary | ICD-10-CM | POA: Diagnosis not present

## 2016-11-23 DIAGNOSIS — C482 Malignant neoplasm of peritoneum, unspecified: Secondary | ICD-10-CM | POA: Diagnosis not present

## 2016-11-28 DIAGNOSIS — R300 Dysuria: Secondary | ICD-10-CM | POA: Diagnosis not present

## 2016-12-25 ENCOUNTER — Other Ambulatory Visit: Payer: Self-pay | Admitting: Gynecology

## 2016-12-25 DIAGNOSIS — Z1231 Encounter for screening mammogram for malignant neoplasm of breast: Secondary | ICD-10-CM

## 2017-01-14 ENCOUNTER — Ambulatory Visit
Admission: RE | Admit: 2017-01-14 | Discharge: 2017-01-14 | Disposition: A | Discharge status: Home / Self Care | Payer: 59 | Source: Ambulatory Visit | Attending: Gynecology | Admitting: Gynecology

## 2017-01-14 DIAGNOSIS — Z1231 Encounter for screening mammogram for malignant neoplasm of breast: Secondary | ICD-10-CM

## 2017-01-25 ENCOUNTER — Ambulatory Visit (INDEPENDENT_AMBULATORY_CARE_PROVIDER_SITE_OTHER): Payer: 59 | Admitting: Gynecology

## 2017-01-25 ENCOUNTER — Encounter: Payer: Self-pay | Admitting: Gynecology

## 2017-01-25 VITALS — BP 138/86 | Ht 67.0 in | Wt 208.0 lb

## 2017-01-25 DIAGNOSIS — M810 Age-related osteoporosis without current pathological fracture: Secondary | ICD-10-CM

## 2017-01-25 DIAGNOSIS — N952 Postmenopausal atrophic vaginitis: Secondary | ICD-10-CM | POA: Diagnosis not present

## 2017-01-25 DIAGNOSIS — Z01411 Encounter for gynecological examination (general) (routine) with abnormal findings: Secondary | ICD-10-CM | POA: Diagnosis not present

## 2017-01-25 NOTE — Addendum Note (Signed)
Addended by: Nelva Nay on: 01/25/2017 10:22 AM   Modules accepted: Orders

## 2017-01-25 NOTE — Progress Notes (Signed)
    [name] [birth year] [id number]        56 y.o.  G0P0 for annual gynecologic exam.    Past medical history,surgical history, problem list, medications, allergies, family history and social history were all reviewed and documented as reviewed in the EPIC chart.  ROS:  Performed with pertinent positives and negatives included in the history, assessment and plan.   Additional significant findings :  None   Exam: [name] assistant Vitals:   01/25/17 0950  BP: 138/86  Weight: 208 lb (94.3 kg)  Height: 5\' 7"  (1.702 m)   Body mass index is 32.58 kg/m.  General appearance:  Normal affect, orientation and appearance. Skin: Grossly normal HEENT: Without gross lesions.  No cervical or supraclavicular adenopathy. Thyroid normal.  Lungs:  Clear without wheezing, rales or rhonchi Cardiac: RR, without RMG Abdominal:  Soft, nontender, without masses, guarding, rebound, organomegaly or hernia Breasts:  Examined lying and sitting without masses, retractions, discharge or axillary adenopathy.  Well-healed left lumpectomy scar Pelvic:  Ext, BUS, Vagina: With atrophic changes  Cervix flush with vagina difficult to visualize or palpate   Adnexa: Without masses or tenderness    Anus and perineum: Normal   Rectovaginal: Normal sphincter tone without palpated masses or tenderness.    Assessment/Plan:  56 y.o. G0P0 female for annual gynecologic exam.  1. Postmenopausal/atrophic genital changes. Status post supracervical hysterectomy and bilateral salpingo-oophorectomy 2008 by Dr Ubaldo Glassing. No significant hot flushes, night sweats, vaginal dryness or any vaginal bleeding. 2. Osteoporosis. DEXA 02/2016 T score -2.7 stable from prior DEXA. Had discussed with patient her increased risk of fracture in options for medication treatment. Patient refused treatment stating she does not want to take any medication. Is on extra vitamin D with recent check of her vitamin D level at her primary physician's  office reported. Encouraged patient to call me if she changes her mind and wants to consider treatment. 3. History of left breast cancer. Exam NED. Mammography 12/2016. Continue with annual mammography when due. 4. Pap smear 2015. Pap smear done today. No history of abnormal Pap smears. 5. Colonoscopy 2014. Repeat at their recommended interval. History of appendiceal carcinoma 2014. Reports recent CT scan NED. She will continue to follow up with her oncologist 6. Health maintenance. Blood pressure 138/86. Recommended to recheck in a non-exam situation. Follow up with primary physician if remains elevated. No routine lab work done as patient reports is done elsewhere. Follow up in one year, sooner as needed.  Anastasio Auerbach MD, 10:10 AM 01/25/2017

## 2017-01-25 NOTE — Patient Instructions (Signed)
Followup in one year for annual exam, sooner as needed. 

## 2017-01-28 LAB — PAP IG W/ RFLX HPV ASCU

## 2017-12-23 ENCOUNTER — Other Ambulatory Visit: Payer: Self-pay | Admitting: Gynecology

## 2017-12-23 DIAGNOSIS — Z1231 Encounter for screening mammogram for malignant neoplasm of breast: Secondary | ICD-10-CM

## 2018-01-23 ENCOUNTER — Ambulatory Visit
Admission: RE | Admit: 2018-01-23 | Discharge: 2018-01-23 | Disposition: A | Discharge status: Home / Self Care | Payer: 59 | Source: Ambulatory Visit | Attending: Gynecology | Admitting: Gynecology

## 2018-01-23 DIAGNOSIS — Z1231 Encounter for screening mammogram for malignant neoplasm of breast: Secondary | ICD-10-CM

## 2018-01-27 ENCOUNTER — Encounter: Payer: Self-pay | Admitting: Gynecology

## 2018-01-27 ENCOUNTER — Ambulatory Visit (INDEPENDENT_AMBULATORY_CARE_PROVIDER_SITE_OTHER): Payer: 59 | Admitting: Gynecology

## 2018-01-27 VITALS — BP 140/84 | Ht 68.0 in | Wt 213.0 lb

## 2018-01-27 DIAGNOSIS — Z01419 Encounter for gynecological examination (general) (routine) without abnormal findings: Secondary | ICD-10-CM | POA: Diagnosis not present

## 2018-01-27 DIAGNOSIS — N952 Postmenopausal atrophic vaginitis: Secondary | ICD-10-CM | POA: Diagnosis not present

## 2018-01-27 DIAGNOSIS — M810 Age-related osteoporosis without current pathological fracture: Secondary | ICD-10-CM

## 2018-01-27 DIAGNOSIS — Z853 Personal history of malignant neoplasm of breast: Secondary | ICD-10-CM | POA: Diagnosis not present

## 2018-01-27 NOTE — Progress Notes (Signed)
    [name] 11/19/1960 [id number]        57 y.o.  G0P0 for annual gynecologic exam.  Without gynecologic complaints.  Past medical history,surgical history, problem list, medications, allergies, family history and social history were all reviewed and documented as reviewed in the EPIC chart.  ROS:  Performed with pertinent positives and negatives included in the history, assessment and plan.   Additional significant findings : None   Exam: [name] assistant Vitals:   01/27/18 1041  BP: 140/84  Weight: 213 lb (96.6 kg)  Height: 5\' 8"  (1.727 m)   Body mass index is 32.39 kg/m.  General appearance:  Normal affect, orientation and appearance. Skin: Grossly normal HEENT: Without gross lesions.  No cervical or supraclavicular adenopathy. Thyroid normal.  Lungs:  Clear without wheezing, rales or rhonchi Cardiac: RR, without RMG Abdominal:  Soft, nontender, without masses, guarding, rebound, organomegaly or hernia Breasts:  Examined lying and sitting without masses, retractions, discharge or axillary adenopathy. Pelvic:  Ext, BUS, Vagina: Flush with upper vagina  Adnexa: Without masses or tenderness    Anus and perineum: Normal   Rectovaginal: Normal sphincter tone without palpated masses or tenderness.    Assessment/Plan:  57 y.o. G0P0 female for annual gynecologic exam status post supracervical hysterectomy and bilateral salpingo-oophorectomy 2008 by Dr. Ubaldo Glassing..   1. Postmenopausal.  No significant menopausal symptoms or any vaginal bleeding. 2. Osteoporosis.  DEXA 2017 T score -2.7 stable from prior DEXA.  We have discussed the risks of fracture and she has declined treatment in the past.  I recommended follow-up DEXA now.  She is unsure whether she would take any medication regardless but will go ahead and get a baseline DEXA for information. 3. History of left breast cancer.  Exam NED.  Mammography 12/2017. 4. History of appendiceal carcinoma.  Is actively being followed  by her surgeons.  Colonoscopy 2014 5. Pap smear 2018.  No Pap smear done today.  No history of abnormal Pap smears. 6. Health maintenance.  No routine lab work done as patient does this elsewhere.  Mild elevated blood pressure 140/84.  Continue to follow-up with her primary physician for routine health maintenance.  Follow-up for bone density.  Low up in 1 year for annual gynecologic exam.   Anastasio Auerbach MD, 11:19 AM 01/27/2018

## 2018-01-27 NOTE — Patient Instructions (Signed)
Followup for bone density as scheduled. 

## 2018-03-04 ENCOUNTER — Encounter: Payer: Self-pay | Admitting: Gynecology

## 2018-03-04 ENCOUNTER — Ambulatory Visit (INDEPENDENT_AMBULATORY_CARE_PROVIDER_SITE_OTHER): Payer: 59

## 2018-03-04 ENCOUNTER — Other Ambulatory Visit: Payer: Self-pay | Admitting: Gynecology

## 2018-03-04 DIAGNOSIS — M810 Age-related osteoporosis without current pathological fracture: Secondary | ICD-10-CM

## 2019-01-26 ENCOUNTER — Other Ambulatory Visit: Payer: Self-pay | Admitting: Gynecology

## 2019-01-26 DIAGNOSIS — Z1231 Encounter for screening mammogram for malignant neoplasm of breast: Secondary | ICD-10-CM

## 2019-01-27 ENCOUNTER — Encounter: Payer: Self-pay | Admitting: Gynecology

## 2019-01-28 ENCOUNTER — Other Ambulatory Visit: Payer: Self-pay

## 2019-01-29 ENCOUNTER — Encounter: Payer: Self-pay | Admitting: Gynecology

## 2019-01-29 ENCOUNTER — Ambulatory Visit (INDEPENDENT_AMBULATORY_CARE_PROVIDER_SITE_OTHER): Payer: 59 | Admitting: Gynecology

## 2019-01-29 VITALS — BP 138/84 | Ht 67.0 in | Wt 208.0 lb

## 2019-01-29 DIAGNOSIS — Z01419 Encounter for gynecological examination (general) (routine) without abnormal findings: Secondary | ICD-10-CM

## 2019-01-29 DIAGNOSIS — M810 Age-related osteoporosis without current pathological fracture: Secondary | ICD-10-CM | POA: Diagnosis not present

## 2019-01-29 DIAGNOSIS — Z853 Personal history of malignant neoplasm of breast: Secondary | ICD-10-CM

## 2019-01-29 DIAGNOSIS — Z23 Encounter for immunization: Secondary | ICD-10-CM

## 2019-01-29 DIAGNOSIS — N952 Postmenopausal atrophic vaginitis: Secondary | ICD-10-CM

## 2019-01-29 NOTE — Progress Notes (Signed)
    [name] 12-May-1960 [id number]        58 y.o.  G0P0 for annual gynecologic exam.  Without gynecologic complaints.  Past medical history,surgical history, problem list, medications, allergies, family history and social history were all reviewed and documented as reviewed in the EPIC chart.  ROS:  Performed with pertinent positives and negatives included in the history, assessment and plan.   Additional significant findings : None   Exam: [name] assistant Vitals:   01/29/19 1000  BP: 138/84  Weight: 208 lb (94.3 kg)  Height: 5\' 7"  (1.702 m)   Body mass index is 32.58 kg/m.  General appearance:  Normal affect, orientation and appearance. Skin: Grossly normal excepting fine petechial rash over arms, legs and lower abdomen HEENT: Without gross lesions.  No cervical or supraclavicular adenopathy. Thyroid normal.  Lungs:  Clear without wheezing, rales or rhonchi Cardiac: RR, without RMG Abdominal:  Soft, nontender, without masses, guarding, rebound, organomegaly or hernia Breasts:  Examined lying and sitting without masses, retractions, discharge or axillary adenopathy. Pelvic:  Ext, BUS, Vagina: With atrophic changes  Cervix: Difficult to visualize flush with upper vagina  Adnexa: Without masses or tenderness    Anus and perineum: Normal   Rectovaginal: Normal sphincter tone without palpated masses or tenderness.    Assessment/Plan:  58 y.o. G0P0 female for annual gynecologic exam.  Status post supracervical hysterectomy and bilateral salpingo-oophorectomy 2008 by Dr. Ubaldo Glassing  1. Postmenopausal.  No significant menopausal symptoms or any vaginal bleeding. 2. Osteoporosis.  DEXA 02/2018 T score -2.6 overall stable from prior DEXA.  We had discussed treatment options in the past and she declined treatment recognizing the risk of fracture.  We again discussed options for treatment today and she declines treatment excepting the risk of fracture.  Recent vitamin D normal. 3.  History of left breast cancer.  Exam NED.  Mammography scheduled in November. 4. History of appendiceal carcinoma.  In the process of arranging colonoscopy. 5. Pap smear 2018.  No Pap smear done today.  No history of abnormal Pap smears.  Plan repeat Pap smear next year at 3-year interval as she still has her cervix. 6. Health maintenance.  Recent lab work through her primary physician's office.  Follow-up 1 year, sooner as needed.   Anastasio Auerbach MD, 10:16 AM 01/29/2019

## 2019-01-29 NOTE — Addendum Note (Signed)
Addended by: Nelva Nay on: 01/29/2019 10:29 AM   Modules accepted: Orders

## 2019-01-29 NOTE — Patient Instructions (Signed)
Follow-up in 1 year for annual exam.  We will plan on doing your Pap smear next year as Dr. Ubaldo Glassing did a supracervical hysterectomy and you still have your cervix.

## 2019-03-10 ENCOUNTER — Other Ambulatory Visit: Payer: Self-pay

## 2019-03-10 ENCOUNTER — Ambulatory Visit
Admission: RE | Admit: 2019-03-10 | Discharge: 2019-03-10 | Disposition: A | Discharge status: Home / Self Care | Payer: 59 | Source: Ambulatory Visit | Attending: Gynecology | Admitting: Gynecology

## 2019-03-10 DIAGNOSIS — Z1231 Encounter for screening mammogram for malignant neoplasm of breast: Secondary | ICD-10-CM

## 2019-12-13 IMAGING — MG DIGITAL SCREENING BILAT W/ TOMO W/ CAD
6 of 10 series · 6 of 30 positions shown · non-contrast
Comparison: Previous exam(s).

CLINICAL DATA: Screening.

EXAM:
DIGITAL SCREENING BILATERAL MAMMOGRAM WITH TOMO AND CAD

[R MLO synth-2D]
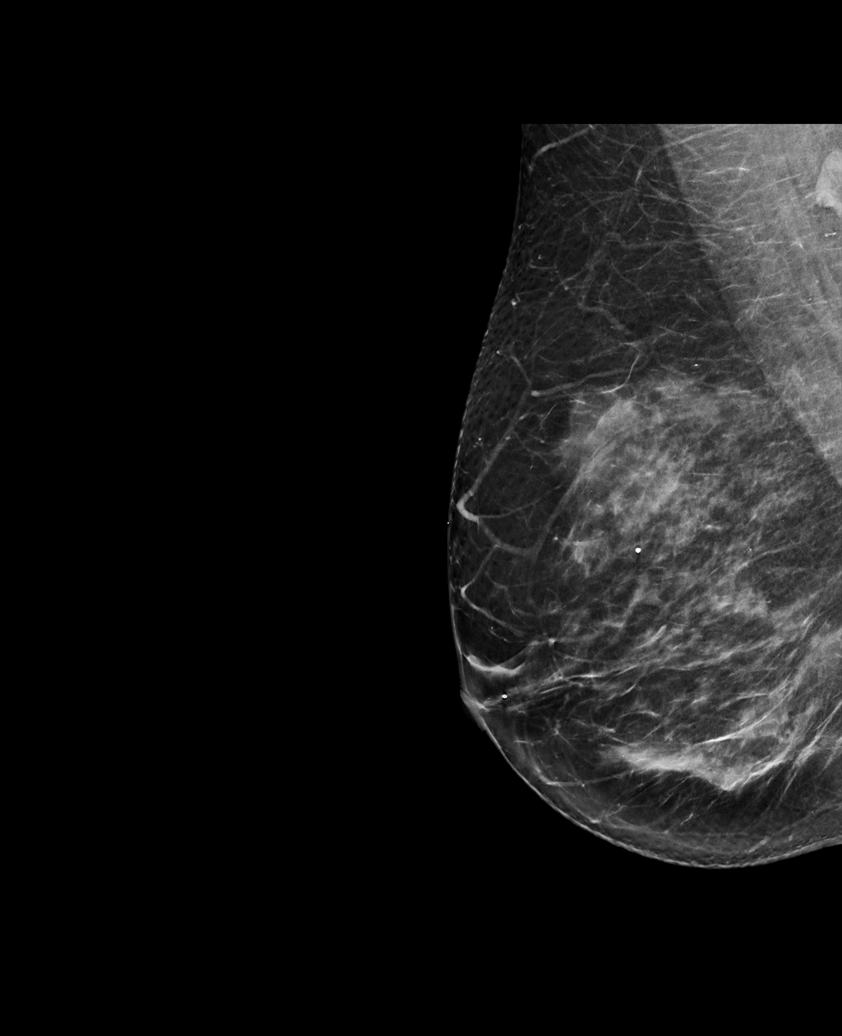

[L MLO synth-2D]
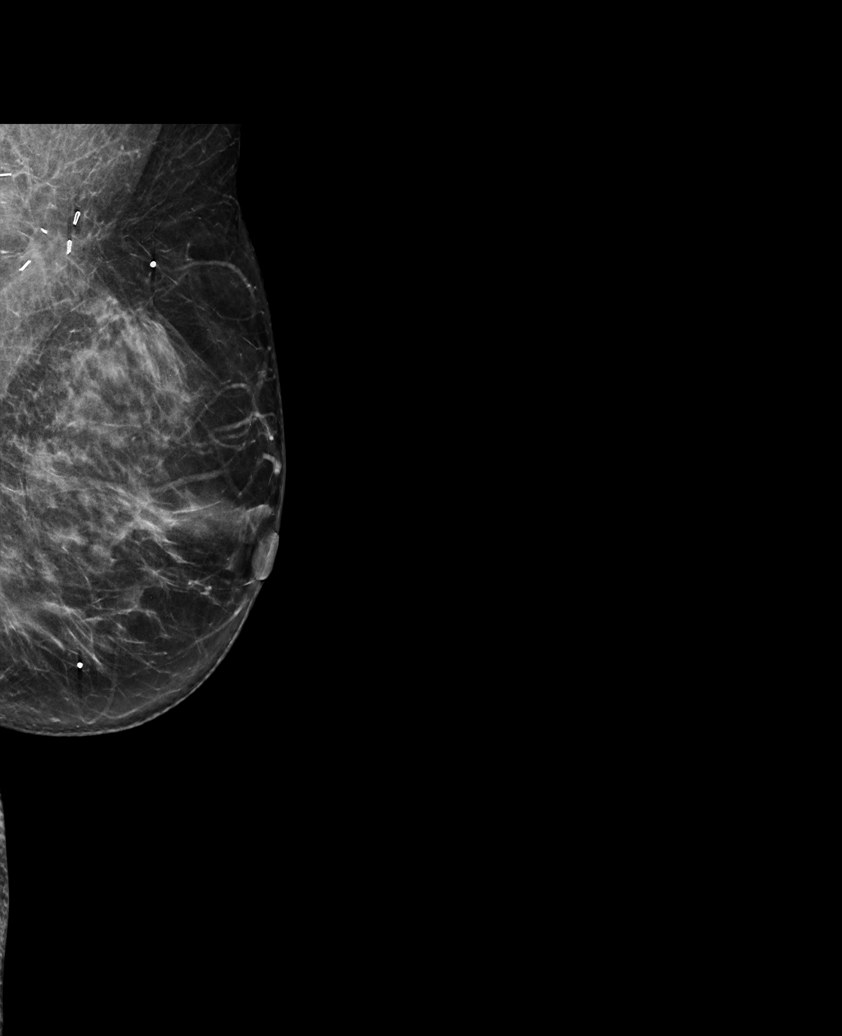

[R CC synth-2D]
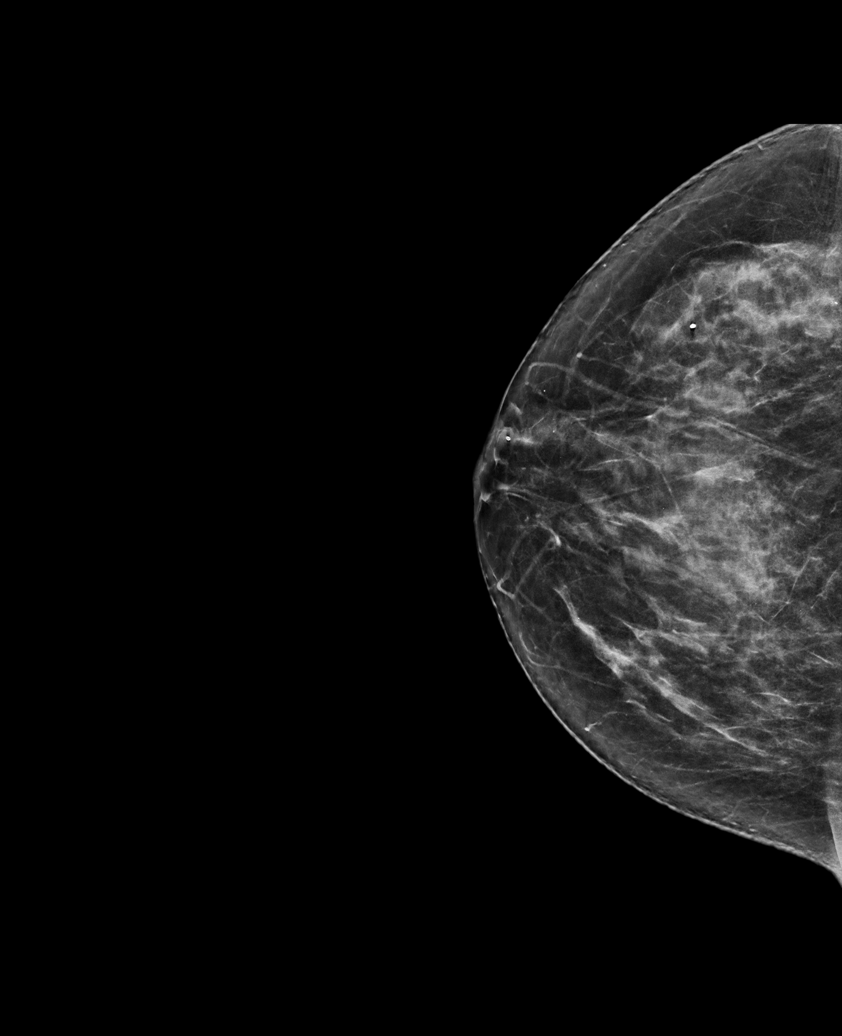

[L CC synth-2D (1 of 2)]
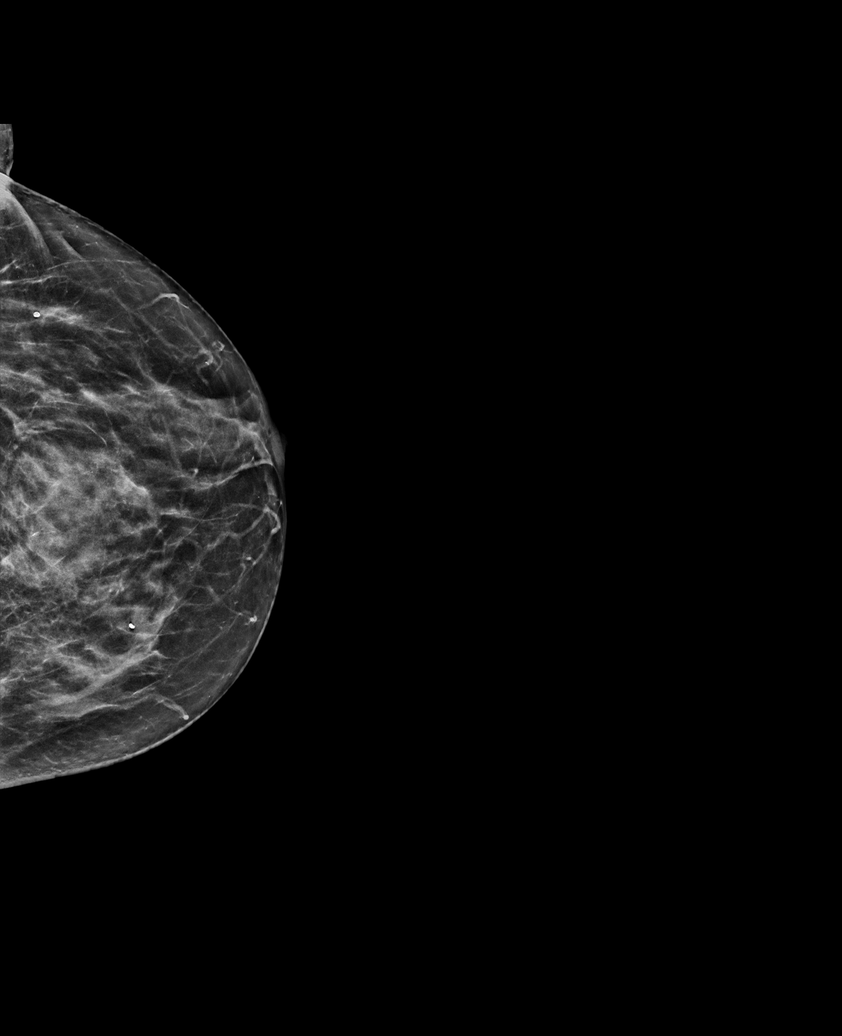

[L CC synth-2D (2 of 2)]
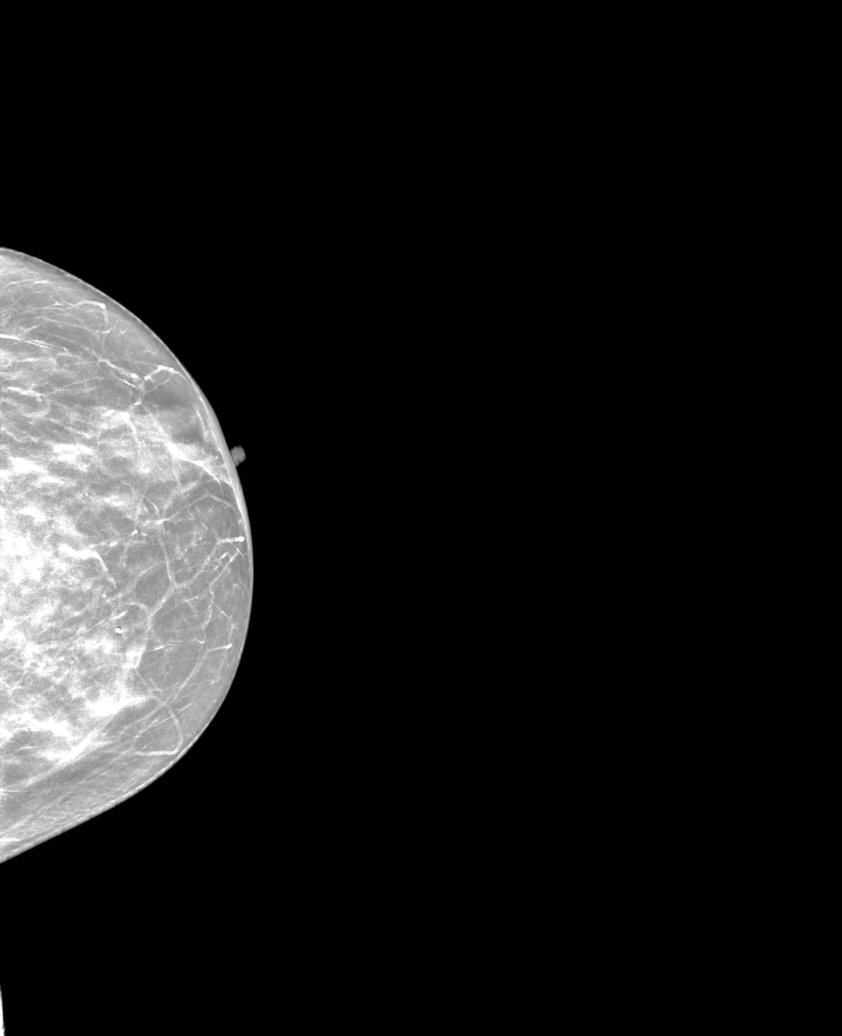

[L MLO tomo · tomo slice 35/68.0]
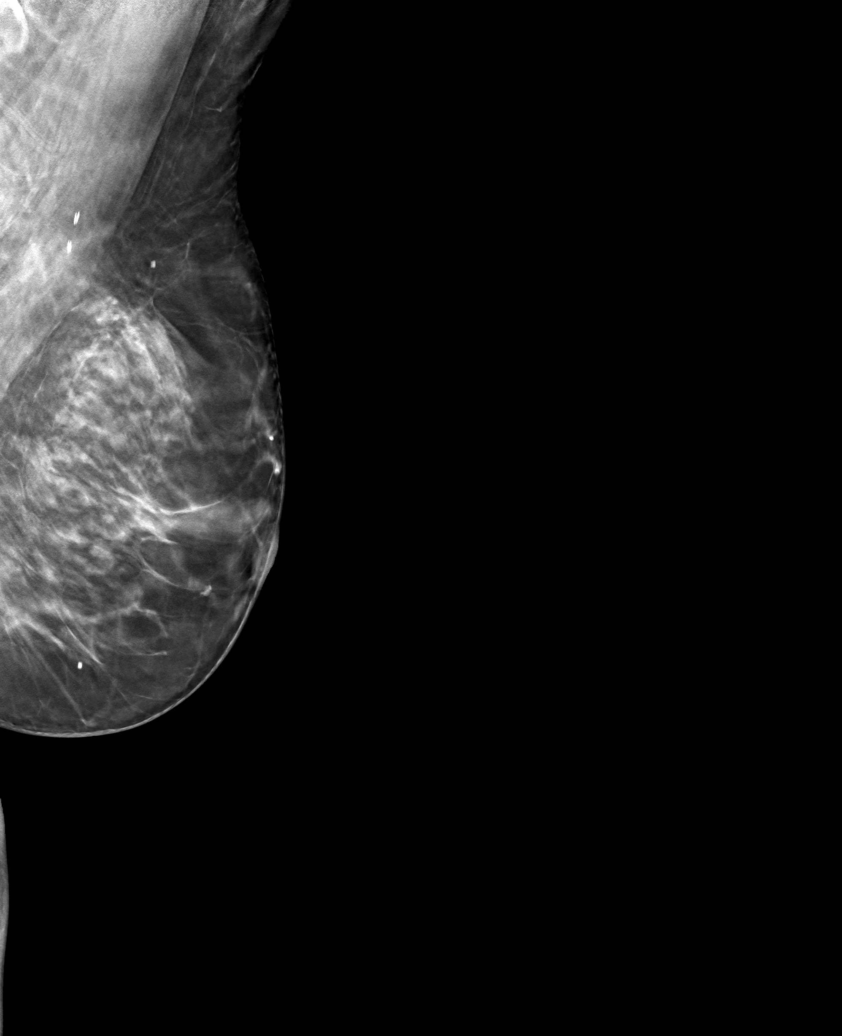

[6 of 30 positions shown; findings below may reference images not displayed]

ACR Breast Density Category c: The breast tissue is heterogeneously
dense, which may obscure small masses.
FINDINGS: There are no findings suspicious for malignancy. Images were
processed with CAD.
IMPRESSION: No mammographic evidence of malignancy. A result letter of this
screening mammogram will be mailed directly to the patient.

RECOMMENDATION:
Screening mammogram in one year. (Code:FT-U-LHB)

BI-RADS CATEGORY  1: Negative.

## 2020-02-04 ENCOUNTER — Other Ambulatory Visit: Payer: Self-pay

## 2020-02-04 ENCOUNTER — Ambulatory Visit: Payer: 59 | Admitting: Obstetrics and Gynecology

## 2020-02-04 ENCOUNTER — Encounter: Payer: Self-pay | Admitting: Obstetrics and Gynecology

## 2020-02-04 VITALS — BP 124/82 | Ht 67.0 in | Wt 215.0 lb

## 2020-02-04 DIAGNOSIS — Z01419 Encounter for gynecological examination (general) (routine) without abnormal findings: Secondary | ICD-10-CM

## 2020-02-04 DIAGNOSIS — Z853 Personal history of malignant neoplasm of breast: Secondary | ICD-10-CM

## 2020-02-04 DIAGNOSIS — M810 Age-related osteoporosis without current pathological fracture: Secondary | ICD-10-CM

## 2020-02-04 DIAGNOSIS — Z124 Encounter for screening for malignant neoplasm of cervix: Secondary | ICD-10-CM | POA: Diagnosis not present

## 2020-02-04 NOTE — Progress Notes (Signed)
   [name] January 27, 1961 [id number]  SUBJECTIVE:  59 y.o. G0P0 female here for an annual gynecologic exam and Pap smear. She has no gynecologic concerns.  Current Outpatient Medications  Medication Sig Dispense Refill  . levothyroxine (SYNTHROID, LEVOTHROID) 25 MCG tablet Take 50 mcg by mouth daily before breakfast.      No current facility-administered medications for this visit.   Allergies: Penicillins  No LMP recorded. Patient has had a hysterectomy.  Past medical history,surgical history, problem list, medications, allergies, family history and social history were all reviewed and documented as reviewed in the EPIC chart.  GYN ROS: no abnormal bleeding, pelvic pain or discharge, no breast pain or new or enlarging lumps on self exam.  No dysuria, frequency, burning, pain with urination, cloudy/malodorous urine.   OBJECTIVE:  BP 124/82   Ht 5\' 7"  (1.702 m)   Wt 215 lb (97.5 kg)   BMI 33.67 kg/m  The patient appears well, alert, oriented, in no distress. ENT normal.   Lungs are clear, good air entry, no wheezes, rhonchi or rales. S1 and S2 normal, no murmurs, regular rate and rhythm.  Abdomen soft without tenderness, guarding, mass or organomegaly.  Neurological is normal, no focal findings. BREAST EXAM: breasts appear normal, no suspicious masses, no skin or nipple changes or axillary nodes  PELVIC EXAM: VULVA: normal appearing vulva with no masses, tenderness or lesions, VAGINA: normal appearing vagina with normal color and discharge, no lesions, CERVIX: Retroverted cervix located in the right lower deep aspect of the vaginal cavity, appears normal, UTERUS: surgically absent, vaginal cuff normal, ADNEXA: no masses, nontender, PAP: Pap smear done today, thin-prep method  Chaperone: [name] present during the examination  ASSESSMENT:  59 y.o. G0P0 here for a breast and pelvic exam  PLAN:   1. Postmenopausal. Prior supracervical hysterectomy and BSO 2008.  No  significant menopausal symptoms.  No vaginal bleeding. 2. Pap smear 12/2016.  No history of abnormal Pap smears.  Cervix is intact and appears normal.  Pap smear is collected today. 3.  History of left breast cancer.  Mammogram 03/2019.  Normal breast exam/NED today.  She will continue with annual mammograms. 4. Colonoscopy 2021.  History of appendiceal carcinoma.  She will follow up at the interval recommended by her GI specialist.  5.  Osteoporosis.  DEXA 02/2018.  T score -2.6.  Treatment options were discussed and she ultimately decided not to pursue treatment.  Osteoporosis diagnosis is again reviewed today and recommendation for treatment but she says that she definitely does not want to do any treatment due to concern for the side effects and risks, acknowledging that they are rare.  The next DEXA would be recommended around now, but as she is not wanting to do treatment, she also indicates she does not want to repeat the DEXA.  Risk of fracture is acknowledged and she will continue to take care in avoiding fracture to the best of her ability but understands that this may not be in her control.  Will revisit at her annual visits.   6. Health maintenance.  No labs today as she normally has these completed with her primary care physician.  She did bring a copy in of her labs today and her cholesterol panel looked good.  Return annually or sooner, prn.  Joseph Pierini MD 02/04/20

## 2020-02-04 NOTE — Addendum Note (Signed)
Addended by: Nelva Nay on: 02/04/2020 11:03 AM   Modules accepted: Orders

## 2020-02-05 LAB — PAP IG W/ RFLX HPV ASCU

## 2020-02-10 ENCOUNTER — Other Ambulatory Visit: Payer: Self-pay | Admitting: Obstetrics and Gynecology

## 2020-02-10 DIAGNOSIS — Z1231 Encounter for screening mammogram for malignant neoplasm of breast: Secondary | ICD-10-CM

## 2020-03-10 ENCOUNTER — Ambulatory Visit
Admission: RE | Admit: 2020-03-10 | Discharge: 2020-03-10 | Disposition: A | Discharge status: Home / Self Care | Payer: 59 | Source: Ambulatory Visit | Attending: Obstetrics and Gynecology | Admitting: Obstetrics and Gynecology

## 2020-03-10 ENCOUNTER — Other Ambulatory Visit: Payer: Self-pay

## 2020-03-10 DIAGNOSIS — Z1231 Encounter for screening mammogram for malignant neoplasm of breast: Secondary | ICD-10-CM

## 2020-05-26 ENCOUNTER — Other Ambulatory Visit: Payer: Self-pay

## 2020-05-26 ENCOUNTER — Emergency Department (HOSPITAL_COMMUNITY)
Admission: EM | Admit: 2020-05-26 | Discharge: 2020-05-27 | Disposition: A | Discharge status: Home / Self Care | Payer: 59 | Attending: Emergency Medicine | Admitting: Emergency Medicine

## 2020-05-26 ENCOUNTER — Encounter (HOSPITAL_COMMUNITY): Payer: Self-pay | Admitting: Emergency Medicine

## 2020-05-26 ENCOUNTER — Emergency Department (HOSPITAL_COMMUNITY): Payer: 59

## 2020-05-26 DIAGNOSIS — Z8616 Personal history of COVID-19: Secondary | ICD-10-CM

## 2020-05-26 DIAGNOSIS — U071 COVID-19: Secondary | ICD-10-CM | POA: Diagnosis not present

## 2020-05-26 DIAGNOSIS — E039 Hypothyroidism, unspecified: Secondary | ICD-10-CM | POA: Diagnosis not present

## 2020-05-26 DIAGNOSIS — I10 Essential (primary) hypertension: Secondary | ICD-10-CM | POA: Diagnosis not present

## 2020-05-26 DIAGNOSIS — R112 Nausea with vomiting, unspecified: Secondary | ICD-10-CM | POA: Diagnosis not present

## 2020-05-26 DIAGNOSIS — R197 Diarrhea, unspecified: Secondary | ICD-10-CM

## 2020-05-26 DIAGNOSIS — Z85038 Personal history of other malignant neoplasm of large intestine: Secondary | ICD-10-CM | POA: Insufficient documentation

## 2020-05-26 DIAGNOSIS — Z853 Personal history of malignant neoplasm of breast: Secondary | ICD-10-CM | POA: Diagnosis not present

## 2020-05-26 DIAGNOSIS — R0602 Shortness of breath: Secondary | ICD-10-CM

## 2020-05-26 DIAGNOSIS — Z79899 Other long term (current) drug therapy: Secondary | ICD-10-CM | POA: Insufficient documentation

## 2020-05-26 HISTORY — DX: Personal history of COVID-19: Z86.16

## 2020-05-26 LAB — CBC
HCT: 40.9 % (ref 36.0–46.0)
Hemoglobin: 13.9 g/dL (ref 12.0–15.0)
MCH: 29.3 pg (ref 26.0–34.0)
MCHC: 34 g/dL (ref 30.0–36.0)
MCV: 86.1 fL (ref 80.0–100.0)
Platelets: 239 10*3/uL (ref 150–400)
RBC: 4.75 MIL/uL (ref 3.87–5.11)
RDW: 15.3 % (ref 11.5–15.5)
WBC: 5.4 10*3/uL (ref 4.0–10.5)
nRBC: 0 % (ref 0.0–0.2)

## 2020-05-26 LAB — BASIC METABOLIC PANEL
Anion gap: 15 (ref 5–15)
BUN: 10 mg/dL (ref 6–20)
CO2: 22 mmol/L (ref 22–32)
Calcium: 9.3 mg/dL (ref 8.9–10.3)
Chloride: 101 mmol/L (ref 98–111)
Creatinine, Ser: 0.85 mg/dL (ref 0.44–1.00)
GFR, Estimated: >60 mL/min (ref >60)
Glucose, Bld: 123 mg/dL — ABNORMAL HIGH (ref 70–99)
Potassium: 3.3 mmol/L — ABNORMAL LOW (ref 3.5–5.1)
Sodium: 138 mmol/L (ref 135–145)

## 2020-05-26 LAB — I-STAT BETA HCG BLOOD, ED (MC, WL, AP ONLY): I-stat hCG, quantitative: 11.6 m[IU]/mL — ABNORMAL HIGH (ref <5)

## 2020-05-26 LAB — TROPONIN I (HIGH SENSITIVITY): Troponin I (High Sensitivity): 9 ng/L (ref <18)

## 2020-05-26 MED ORDER — ALBUTEROL SULFATE HFA 108 (90 BASE) MCG/ACT IN AERS: 2.0000 | INHALATION_SPRAY | RESPIRATORY_TRACT | Status: DC | PRN

## 2020-05-26 NOTE — ED Triage Notes (Signed)
Pt reports began to experience COVID symptoms on the 17th and was diagnosed on the 20th.  Pt reports since then she has been SOB, experiencing n/v, cough, headache and bilateral pain under her shoulder blades.

## 2020-05-27 LAB — TROPONIN I (HIGH SENSITIVITY): Troponin I (High Sensitivity): 11 ng/L (ref <18)

## 2020-05-27 MED ORDER — ONDANSETRON HCL 4 MG/2ML IJ SOLN
4.0000 mg | Freq: Once | INTRAMUSCULAR | Status: AC
  Administered 2020-05-27: 4 mg via INTRAVENOUS
  Filled 2020-05-27: qty 2

## 2020-05-27 MED ORDER — ONDANSETRON 4 MG PO TBDP: ORAL_TABLET | ORAL | 0 refills | Status: DC

## 2020-05-27 MED ORDER — SODIUM CHLORIDE 0.9 % IV BOLUS
1000.0000 mL | Freq: Once | INTRAVENOUS | Status: AC
  Administered 2020-05-27: 1000 mL via INTRAVENOUS

## 2020-05-27 NOTE — ED Provider Notes (Signed)
[facility] EMERGENCY DEPARTMENT Provider Note   CSN: [id number] Arrival date & time: 05/26/20  2105     History Chief Complaint  Patient presents with  . Covid Positive  . Shortness of Breath    [name] is a 60 y.o. female.  59 yo F with a chief complaints of COVID-19 infection.  Having some nausea and vomiting and not able to eat and drink over the past 48 hours or so.  Patient has been having some mild worsening shortness of breath especially when she exerts herself.  She is worried that she is getting dehydrated.  Was given a dose of azithromycin at the onset of the illness which she thinks made her have profuse diarrhea which she thinks may have caused her to be more dehydrated as well.  The history is provided by the patient.  Shortness of Breath Severity:  Moderate Onset quality:  Gradual Duration:  11 days Timing:  Constant Progression:  Worsening Chronicity:  New Relieved by:  Nothing Worsened by:  Nothing Ineffective treatments:  None tried Associated symptoms: abdominal pain (when she vomits), cough and vomiting   Associated symptoms: no chest pain, no fever, no headaches and no wheezing        Past Medical History:  Diagnosis Date  . Allergic rhinitis   . Breast cancer ([place])    left breast ca, lumpectomy, chemotherapy, radiation.  2003  . 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 HSP (Henoch Schonlein purpura) (HCC)   . Hypothyroid 2015  . Non-STEMI (non-ST elevated myocardial infarction) [facility])    June 23, 2010, presumed vasospasm in the absence CAD at catheter  . Osteoporosis 02/2018   T score -2.6 overall stable from prior DEXA with slight loss at left hip.    Patient Active Problem List   Diagnosis Date Noted  . 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 Status post total hysterectomy and bilateral salpingo-oophorectomy (BSO) 06/18/2015   . 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 Non-STEMI (non-ST elevated myocardial infarction) ([place])   . Hypertension   . Dyslipidemia   . 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 APPENDECTOMY    . BREAST BIOPSY  1996   right  . BREAST LUMPECTOMY  2003   Left  . COLONOSCOPY N/A 03/09/2013   Procedure: COLONOSCOPY;  Surgeon: Gatha Mayer, MD;  Location: [place];  Service: Endoscopy;  Laterality: N/A;  . HIPEC    . LAPAROSCOPIC PARTIAL COLECTOMY N/A 03/10/2013   Procedure: LAPAROSCOPIC PARTIAL COLECTOMY;  Surgeon: Joyice Faster. Cornett, MD;  Location: [place];  Service: General;  Laterality: N/A;  . OOPHORECTOMY     BSO  . TOTAL ABDOMINAL HYSTERECTOMY  2008   Supracervical with BSO     OB History    Gravida  0   Para      Term      Preterm      AB      Living        SAB      IAB      Ectopic      Multiple      Live Births              Family History  Problem Relation Age of Onset  . Diabetes Mother   . Arthritis Mother   .  Clotting disorder Mother   . Breast cancer Mother 49  . Heart attack Brother   . Hypertension Father   . Breast cancer Paternal Aunt 8  . Stroke Brother   . Heart failure Brother   . Clotting disorder Niece     Social History   Tobacco Use  . Smoking status: Never Smoker  . Smokeless tobacco: Never Used  Vaping Use  . Vaping Use: Never used  Substance Use Topics  . Alcohol use: Yes    Alcohol/week: 0.0 standard drinks    Comment: Rare  . Drug use: No    Home Medications Prior to Admission medications   Medication Sig Start Date End Date Taking? Authorizing Provider  ondansetron (ZOFRAN ODT) 4 MG disintegrating tablet 4mg  ODT q4 hours prn nausea/vomit 05/27/20  Yes Deno Etienne, DO  levothyroxine (SYNTHROID, LEVOTHROID) 25 MCG tablet Take 50 mcg by mouth daily before breakfast.     [provider]    Allergies     Penicillins  Review of Systems   Review of Systems  Constitutional: Positive for appetite change. Negative for chills and fever.  HENT: Negative for congestion and rhinorrhea.   Eyes: Negative for redness and visual disturbance.  Respiratory: Positive for cough and shortness of breath. Negative for wheezing.   Cardiovascular: Negative for chest pain and palpitations.  Gastrointestinal: Positive for abdominal pain (when she vomits), diarrhea, nausea and vomiting.  Genitourinary: Negative for dysuria and urgency.  Musculoskeletal: Negative for arthralgias and myalgias.  Skin: Negative for pallor and wound.  Neurological: Negative for dizziness and headaches.    Physical Exam Updated Vital Signs BP (!) 171/99   Pulse 84   Temp 98.8 F (37.1 C) (Oral)   Resp 14   Ht 5\' 7"  (1.702 m)   Wt 94.3 kg   SpO2 96%   BMI 32.58 kg/m   Physical Exam Vitals and nursing note reviewed.  Constitutional:      General: She is not in acute distress.    Appearance: She is well-developed and well-nourished. She is not diaphoretic.  HENT:     Head: Normocephalic and atraumatic.  Eyes:     Extraocular Movements: EOM normal.     Pupils: Pupils are equal, round, and reactive to light.  Cardiovascular:     Rate and Rhythm: Normal rate and regular rhythm.     Heart sounds: No murmur heard. No friction rub. No gallop.   Pulmonary:     Effort: Pulmonary effort is normal.     Breath sounds: No wheezing or rales.  Abdominal:     General: There is no distension.     Palpations: Abdomen is soft.     Tenderness: There is no abdominal tenderness.  Musculoskeletal:        General: No tenderness or edema.     Cervical back: Normal range of motion and neck supple.  Skin:    General: Skin is warm and dry.  Neurological:     Mental Status: She is alert and oriented to person, place, and time.  Psychiatric:        Mood and Affect: Mood and affect normal.        Behavior: Behavior normal.     ED  Results / Procedures / Treatments   Labs (all labs ordered are listed, but only abnormal results are displayed) Labs Reviewed  BASIC METABOLIC PANEL - Abnormal; Notable for the following components:      Result Value   Potassium 3.3 (*)  Glucose, Bld 123 (*)    All other components within normal limits  I-STAT BETA HCG BLOOD, ED (MC, WL, AP ONLY) - Abnormal; Notable for the following components:   I-stat hCG, quantitative 11.6 (*)    All other components within normal limits  CBC  TROPONIN I (HIGH SENSITIVITY)  TROPONIN I (HIGH SENSITIVITY)    EKG EKG Interpretation  Date/Time:  Thursday May 26 2020 21:38:28 EST Ventricular Rate:  96 PR Interval:  158 QRS Duration: 80 QT Interval:  366 QTC Calculation: 462 R Axis:   24 Text Interpretation: Normal sinus rhythm Cannot rule out Inferior infarct , age undetermined Anterior infarct , age undetermined Abnormal ECG No significant change since last tracing Confirmed by Calvert Cantor [id number]) on 05/27/2020 8:12:45 AM   Radiology DG Chest Port 1 View  Result Date: 05/26/2020 CLINICAL DATA:  60 year old female with COVID-19 symptoms. EXAM: PORTABLE CHEST 1 VIEW COMPARISON:  Chest radiograph dated 06/23/2010 CT dated 06/23/2010 FINDINGS: Bilateral predominantly peripheral faint densities consistent with infiltrate, likely viral or atypical in etiology and in keeping with COVID-19. There is no pleural effusion pneumothorax. The cardiac silhouette is within limits. No acute osseous pathology. IMPRESSION: Bilateral infiltrate in keeping with COVID-19.  [facility] Electronically Signed   By: Anner Crete M.D.   On: 05/26/2020 21:53    Procedures Procedures   Medications Ordered in ED Medications  albuterol (VENTOLIN HFA) 108 (90 Base) MCG/ACT inhaler 2 puff (has no administration in time range)  sodium chloride 0.9 % bolus 1,000 mL (1,000 mLs Intravenous New Bag/Given 05/27/20 0918)  ondansetron (ZOFRAN) injection 4 mg (4 mg  Intravenous Given 05/27/20 6045)    ED Course  I have reviewed the triage vital signs and the nursing notes.  Pertinent labs & imaging results that were available during my care of the patient were reviewed by me and considered in my medical decision making (see chart for details).    MDM Rules/Calculators/A&P                          60 yo F with a chief complaints of COVID-19 infection.  Now having nausea vomiting and diarrhea.  Not able to keep anything down for about 48 hours.  The patient is not requiring oxygen here.  Ambulating without issue.  We will give a bolus of IV fluids.  Oral trial.  Very mild hypokalemia.  No anemia.  No leukocytosis.  Troponins negative x2.  Patient reassessed and feeling better.  Fluids still infusing.  Tolerating PO.  11:03 AM:  I have discussed the diagnosis/risks/treatment options with the patient and believe the pt to be eligible for discharge home to follow-up with PCP. We also discussed returning to the ED immediately if new or worsening sx occur. We discussed the sx which are most concerning (e.g., sudden worsening pain, fever, inability to tolerate by mouth) that necessitate immediate return. Medications administered to the patient during their visit and any new prescriptions provided to the patient are listed below.  Medications given during this visit Medications  albuterol (VENTOLIN HFA) 108 (90 Base) MCG/ACT inhaler 2 puff (has no administration in time range)  sodium chloride 0.9 % bolus 1,000 mL (1,000 mLs Intravenous New Bag/Given 05/27/20 0918)  ondansetron (ZOFRAN) injection 4 mg (4 mg Intravenous Given 05/27/20 4098)     The patient appears reasonably screen and/or stabilized for discharge and I doubt any other medical condition or other [facility] requiring further screening, evaluation, or treatment in  the ED at this time prior to discharge.   Final Clinical Impression(s) / ED Diagnoses Final diagnoses:  COVID-19 virus infection  Nausea  vomiting and diarrhea    Rx / DC Orders ED Discharge Orders         Ordered    ondansetron (ZOFRAN ODT) 4 MG disintegrating tablet        05/27/20 [place], [place], DO 05/27/20 1103

## 2020-05-27 NOTE — Discharge Instructions (Signed)
Return for worsening abdominal pain, inability to eat or drink.  Take the nausea meds as prescribed.  Take imodium for diarrhea.

## 2020-05-27 NOTE — ED Notes (Signed)
Pt ambulated in room with sp02 96%. Pt denies any sob at the time of ambulation.

## 2020-05-27 NOTE — ED Notes (Signed)
Pt provided with hydration

## 2020-06-01 ENCOUNTER — Telehealth: Payer: Self-pay | Admitting: Nurse Practitioner

## 2020-06-01 NOTE — Telephone Encounter (Signed)
[name] called pt for HFU. Pt reports feeling terrible & states just left PCP office with prescription for prednisone and inhaler. Pt reports not being advised of double pneumonia at ED & no follow up instructions. Advised appointment in person at [facility] tomorrow would be beneficial. Pt refused any follow up care.

## 2020-06-01 NOTE — Telephone Encounter (Signed)
[facility] ([phone]) called pt for HFU. LVM for return call to schedule HFU appt. 

## 2020-09-28 DIAGNOSIS — Z8619 Personal history of other infectious and parasitic diseases: Secondary | ICD-10-CM

## 2020-09-28 HISTORY — DX: Personal history of other infectious and parasitic diseases: Z86.19

## 2020-12-26 ENCOUNTER — Other Ambulatory Visit: Payer: Self-pay | Admitting: Obstetrics and Gynecology

## 2020-12-26 DIAGNOSIS — Z1231 Encounter for screening mammogram for malignant neoplasm of breast: Secondary | ICD-10-CM

## 2021-02-07 ENCOUNTER — Ambulatory Visit: Payer: 59 | Admitting: Obstetrics and Gynecology

## 2021-02-08 ENCOUNTER — Other Ambulatory Visit: Payer: Self-pay

## 2021-02-08 ENCOUNTER — Ambulatory Visit (INDEPENDENT_AMBULATORY_CARE_PROVIDER_SITE_OTHER): Payer: 59 | Admitting: Obstetrics and Gynecology

## 2021-02-08 ENCOUNTER — Encounter: Payer: Self-pay | Admitting: Obstetrics and Gynecology

## 2021-02-08 VITALS — BP 136/88 | HR 74 | Ht 67.0 in | Wt 216.0 lb

## 2021-02-08 DIAGNOSIS — Z01419 Encounter for gynecological examination (general) (routine) without abnormal findings: Secondary | ICD-10-CM | POA: Diagnosis not present

## 2021-02-08 NOTE — Patient Instructions (Signed)

## 2021-02-08 NOTE — Progress Notes (Signed)
60 y.o. G0P0 Married Caucasian female here for annual exam.    Has yearly CT scan in February for her hx of appendix cancer.   Retired from the city of [employer].  Likes to sew.  44 yo dog, Sadie.  PCP:   Domenick Gong, MD  No LMP recorded. Patient has had a hysterectomy.           Sexually active: No. Husband with ED/bladder CA The current method of family planning is status post SUPRACERVICAL hysterectomy.    Exercising: Yes.     Walks daily  Smoker:  no  Health Maintenance: Pap: 02-04-20 Neg, 01-25-17 Neg, 01-19-14 Neg History of abnormal Pap:  remote history of abnormal pap with Dr. Ubaldo Glassing. No colposcopy.  Follow ups normal.  This was about 30 years ago.  MMG:  03-10-20 3D/Neg/Birads1--Hx Lt.Br.CA--Has appt. Colonoscopy: 03-09-13 normal.  2021 - normal - Dr. Earlean Shawl.   Due in 2026. BMD:  03-04-18  Result :Osteoporosis--declines treatment TDaP: 01-29-19 Gardasil:   no HIV: No Hep C:2015 Neg Screening Labs:  PCP.   reports that she has never smoked. She has never used smokeless tobacco. She reports that she does not currently use alcohol. She reports that she does not use drugs.  Past Medical History:  Diagnosis Date   Allergic rhinitis    Breast cancer ([place])    left breast ca, lumpectomy, chemotherapy, radiation.  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    Past Surgical History:  Procedure Laterality Date   APPENDECTOMY     BREAST BIOPSY  1996   right   BREAST LUMPECTOMY  2003   Left   COLONOSCOPY N/A 03/09/2013   Procedure: COLONOSCOPY;  Surgeon: Gatha Mayer, MD;   Location: [place];  Service: Endoscopy;  Laterality: N/A;   HIPEC     LAPAROSCOPIC PARTIAL COLECTOMY N/A 03/10/2013   Procedure: LAPAROSCOPIC PARTIAL COLECTOMY;  Surgeon: Joyice Faster. Cornett, MD;  Location: [place];  Service: General;  Laterality: N/A;   OOPHORECTOMY     BSO   TOTAL ABDOMINAL HYSTERECTOMY  2008   Supracervical with BSO    Current Outpatient Medications  Medication Sig Dispense Refill   levothyroxine (SYNTHROID) 50 MCG tablet Synthroid 50 mcg tablet     No current facility-administered medications for this visit.    Family History  Problem Relation Age of Onset   Diabetes Mother    Arthritis Mother    Clotting disorder Mother    Breast cancer Mother 55   Heart attack Brother    Hypertension Father    Breast cancer Paternal Aunt 45   Stroke Brother    Heart failure Brother    Clotting disorder Niece     Review of Systems  All other systems reviewed and are negative.  Exam:   BP 136/88   Pulse 74   Ht 5\' 7"  (1.702 m)   Wt 216 lb (98 kg)   SpO2 100%   BMI 33.83 kg/m     General appearance: alert, cooperative and appears stated age Head: normocephalic, without obvious abnormality, atraumatic  Neck: no adenopathy, supple, symmetrical, trachea midline and thyroid normal to inspection and palpation Lungs: clear to auscultation bilaterally Breasts: normal appearance, no masses or tenderness, No nipple retraction or dimpling, No nipple discharge or bleeding, No axillary adenopathy Heart: regular rate and rhythm Abdomen: soft, non-tender; no masses, no organomegaly Extremities: extremities normal, atraumatic, no cyanosis or edema Skin: skin color, texture, turgor normal. No rashes or lesions Lymph nodes: cervical, supraclavicular, and axillary nodes normal. Neurologic: grossly normal  Pelvic: External genitalia:  no lesions              No abnormal inguinal nodes palpated.              Urethra:  normal appearing urethra with no masses, tenderness or  lesions              Bartholins and Skenes: normal                 Vagina: normal appearing vagina with normal color and discharge, no lesions.  Atrophy noted.               Cervix: no lesions              Pap taken: no. Bimanual Exam:  Uterus:  normal size, contour, position, consistency, mobility, non-tender              Adnexa: no mass, fullness, tenderness              Rectal exam: yes.  Confirms.              Anus:  normal sphincter tone, no lesions  Chaperone was present for exam:  Estill Bamberg, CMA.  Assessment:   Well woman visit with gynecologic exam. Status post supracervical hysterectomy with BSO 2008.  Hx appendiceal cancer.  Status post hemicolectomy.  Hx left breast cancer.  Osteoporosis.  Hx MI.  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   Had hepatic vein thrombosis.  Tx with Lovenox for 3 years.  Vaginal atrophy.   Plan: Mammogram screening discussed. Self breast awareness reviewed. Pap in 2024.  Guidelines for Calcium, Vitamin D, regular exercise program including cardiovascular and weight bearing exercise. Labs with PCP.  We discussed osteoporosis, treatment, reducing risk of falls.  She may consider BMD next year.  We discussed water based lubricants, cooking oils, and vaginal vitamin E for treating vaginal atrophy.  Follow up annually and prn.   After visit summary provided.

## 2021-03-14 ENCOUNTER — Other Ambulatory Visit: Payer: Self-pay

## 2021-03-14 ENCOUNTER — Ambulatory Visit
Admission: RE | Admit: 2021-03-14 | Discharge: 2021-03-14 | Disposition: A | Discharge status: Home / Self Care | Payer: 59 | Source: Ambulatory Visit | Attending: Obstetrics and Gynecology | Admitting: Obstetrics and Gynecology

## 2021-03-14 DIAGNOSIS — Z1231 Encounter for screening mammogram for malignant neoplasm of breast: Secondary | ICD-10-CM

## 2021-03-15 ENCOUNTER — Other Ambulatory Visit: Payer: Self-pay | Admitting: Obstetrics and Gynecology

## 2021-03-15 DIAGNOSIS — R928 Other abnormal and inconclusive findings on diagnostic imaging of breast: Secondary | ICD-10-CM

## 2021-04-10 ENCOUNTER — Ambulatory Visit
Admission: RE | Admit: 2021-04-10 | Discharge: 2021-04-10 | Disposition: A | Discharge status: Home / Self Care | Payer: 59 | Source: Ambulatory Visit | Attending: Obstetrics and Gynecology | Admitting: Obstetrics and Gynecology

## 2021-04-10 ENCOUNTER — Ambulatory Visit: Payer: 59

## 2021-04-10 ENCOUNTER — Other Ambulatory Visit: Payer: Self-pay | Admitting: Obstetrics and Gynecology

## 2021-04-10 DIAGNOSIS — R928 Other abnormal and inconclusive findings on diagnostic imaging of breast: Secondary | ICD-10-CM

## 2022-01-08 ENCOUNTER — Other Ambulatory Visit: Payer: Self-pay | Admitting: Urology

## 2022-01-09 NOTE — Progress Notes (Addendum)
COVID Vaccine received:  '[x]'$  No '[]'$  Yes Date of any COVID positive Test in last 90 days: None  PCP - Domenick Gong, MD Cardiologist - None   Chest x-ray - 05-26-2020  Epic EKG -  05-27-20  Epic Stress Test - n/a ECHO - n/a Cardiac Cath - 2012  (Dr. Dola Argyle) Vasospasm per cath,  no stents  Pacemaker/ICD device     '[x]'$  N/A Spinal Cord Stimulator:'[x]'$  No '[]'$  Yes      Other Implants:   Bowel Prep - none per patient  History of Sleep Apnea? '[x]'$  No '[]'$  Yes   Sleep Study Date:   CPAP used?- '[x]'$  No '[]'$  Yes  (Instruct to bring their mask & Tubing)  Does the patient monitor blood sugar? '[]'$  No '[]'$  Yes  '[x]'$  N/A  Blood Thinner Instructions:none Aspirin Instructions: ASA 500 mg for headaches Last Dose:01-11-22  ERAS Protocol Ordered: '[x]'$  No  '[]'$  Yes PRE-SURGERY '[]'$  ENSURE  '[]'$  G2   Comments: Hx Appendiceal cancer, Hx MRSA  Activity level: Patient can climb a flight of stairs without difficulty;  '[x]'$  No CP  '[x]'$  No SOB,   Anesthesia review: NSTEMI vs Vasospasm in 2012, HTN,     Patient's BP was extremely high this morning. I made Janett Billow aware and we told the patient she will need to get in touch with Dr. Loren Racer office about this asap- she is not taking any BP meds at this time. Mrs. Therriault voiced understanding of this and will call Dr. Osborne Casco today.   Patient denies shortness of breath, fever, cough and chest pain at PAT appointment.  Patient verbalized understanding and agreement to the Pre-Surgical Instructions that were given to them at this PAT appointment. Patient was also educated of the need to review these PAT instructions again prior to his/her surgery.I reviewed the appropriate phone numbers to call if they have any and questions or concerns.

## 2022-01-09 NOTE — Patient Instructions (Signed)
SURGICAL WAITING ROOM VISITATION Patients having surgery or a procedure may have no more than 2 support people in the waiting area - these visitors may rotate in the visitor waiting room.   Children under the age of 35 must have an adult with them who is not the patient. If the patient needs to stay at the hospital during part of their recovery, the visitor guidelines for inpatient rooms apply.  PRE-OP VISITATION  Pre-op nurse will coordinate an appropriate time for 1 support person to accompany the patient in pre-op.  This support person may not rotate.  This visitor will be contacted when the time is appropriate for the visitor to come back in the pre-op area.  Please refer to the [facility] website for the visitor guidelines for Inpatients (after your surgery is over and you are in a regular room).  You are not required to quarantine at this time prior to your surgery. However, you must do this: Hand Hygiene often Do NOT share personal items Notify your provider if you are in close contact with someone who has COVID or you develop fever 100.4 or greater, new onset of sneezing, cough, sore throat, shortness of breath or body aches.       Your procedure is scheduled on:  Wednesday    January 17, 2022  Report to [facility] Main Entrance.  Report to admitting at:  06:15   AM  +++++Call this number if you have any questions or problems the morning of surgery [phone]  Do not eat food :After Midnight the night prior to your surgery/procedure.  After Midnight you may have the following liquids until  05:30  AM DAY OF SURGERY  Clear Liquid Diet Water Black Coffee (sugar ok, NO MILK/CREAM OR CREAMERS)  Tea (sugar ok, NO MILK/CREAM OR CREAMERS) regular and decaf                             Plain Jell-O (NO RED)                                           Fruit ices (not with fruit pulp, NO RED)                                     Popsicles (NO RED)                                                                   Juice: apple, WHITE grape, WHITE cranberry Sports drinks like Gatorade (NO RED)                FOLLOW BOWEL PREP AND ANY ADDITIONAL PRE OP INSTRUCTIONS YOU RECEIVED FROM YOUR SURGEON'S OFFICE!!!   Oral Hygiene is also important to reduce your risk of infection.        Remember - BRUSH YOUR TEETH THE MORNING OF SURGERY WITH YOUR REGULAR TOOTHPASTE   Take ONLY these medicines the morning of surgery with A SIP OF WATER: Synthroid  You may not have any metal on your body including hair pins, jewelry, and body piercing  Do not wear make-up, lotions, powders, perfumes, or deodorant  Do not wear nail polish including gel and S&S, artificial / acrylic nails, or any other type of covering on natural nails including finger and toenails. If you have artificial nails, gel coating, etc., that needs to be removed by a nail salon, Please have this removed prior to surgery. Not doing so may mean that your surgery could be cancelled or delayed if the Surgeon or anesthesia staff feels like they are unable to monitor you safely.   Do not shave 48 hours prior to surgery to avoid nicks in your skin which may contribute to postoperative infections.   Contacts, Hearing Aids, dentures or bridgework may not be worn into surgery.   DO NOT [place]. PHARMACY WILL DISPENSE MEDICATIONS LISTED ON YOUR MEDICATION LIST TO YOU DURING YOUR ADMISSION [place]!   Patients discharged on the day of surgery will not be allowed to drive home.  Someone NEEDS to stay with you for the first 24 hours after anesthesia.  Special Instructions: Bring a copy of your healthcare power of attorney and living will documents the day of surgery, if you wish to have them scanned into your [place] Medical Records- EPIC  Please read over the following fact sheets you were given: IF YOU HAVE QUESTIONS ABOUT YOUR PRE-OP INSTRUCTIONS, PLEASE  CALL [phone]  ([place])   [place] - Preparing for Surgery Before surgery, you can play an important role.  Because skin is not sterile, your skin needs to be as free of germs as possible.  You can reduce the number of germs on your skin by washing with CHG (chlorahexidine gluconate) soap before surgery.  CHG is an antiseptic cleaner which kills germs and bonds with the skin to continue killing germs even after washing. Please DO NOT use if you have an allergy to CHG or antibacterial soaps.  If your skin becomes reddened/irritated stop using the CHG and inform your nurse when you arrive at Short Stay. Do not shave (including legs and underarms) for at least 48 hours prior to the first CHG shower.  You may shave your face/neck.  Please follow these instructions carefully:  1.  Shower with CHG Soap the night before surgery and the  morning of surgery.  2.  If you choose to wash your hair, wash your hair first as usual with your normal  shampoo.  3.  After you shampoo, rinse your hair and body thoroughly to remove the shampoo.                             4.  Use CHG as you would any other liquid soap.  You can apply chg directly to the skin and wash.  Gently with a scrungie or clean washcloth.  5.  Apply the CHG Soap to your body ONLY FROM THE NECK DOWN.   Do not use on face/ open                           Wound or open sores. Avoid contact with eyes, ears mouth and genitals (private parts).                       Wash face,  Genitals (private parts) with your  normal soap.             6.  Wash thoroughly, paying special attention to the area where your  surgery  will be performed.  7.  Thoroughly rinse your body with warm water from the neck down.  8.  DO NOT shower/wash with your normal soap after using and rinsing off the CHG Soap.            9.  Pat yourself dry with a clean towel.            10.  Wear clean pajamas.            11.  Place clean sheets on your bed the night of your first  shower and do not  sleep with pets.  ON THE DAY OF SURGERY : Do not apply any lotions/deodorants the morning of surgery.  Please wear clean clothes to the hospital/surgery center.    FAILURE TO FOLLOW THESE INSTRUCTIONS MAY RESULT IN THE CANCELLATION OF YOUR SURGERY  PATIENT SIGNATURE_________________________________  NURSE SIGNATURE__________________________________  ________________________________________________________________________

## 2022-01-11 ENCOUNTER — Encounter (HOSPITAL_COMMUNITY)
Admission: RE | Admit: 2022-01-11 | Discharge: 2022-01-11 | Disposition: A | Discharge status: Home / Self Care | Payer: 59 | Source: Ambulatory Visit | Attending: Urology | Admitting: Urology

## 2022-01-11 ENCOUNTER — Encounter (HOSPITAL_COMMUNITY): Payer: Self-pay

## 2022-01-11 ENCOUNTER — Other Ambulatory Visit: Payer: Self-pay

## 2022-01-11 VITALS — BP 161/107 | HR 82 | Temp 98.2°F | Resp 22 | Ht 67.0 in | Wt 215.0 lb

## 2022-01-11 DIAGNOSIS — Z8614 Personal history of Methicillin resistant Staphylococcus aureus infection: Secondary | ICD-10-CM | POA: Diagnosis not present

## 2022-01-11 DIAGNOSIS — Z01818 Encounter for other preprocedural examination: Secondary | ICD-10-CM | POA: Insufficient documentation

## 2022-01-11 DIAGNOSIS — I10 Essential (primary) hypertension: Secondary | ICD-10-CM | POA: Insufficient documentation

## 2022-01-11 HISTORY — DX: Pneumonia, unspecified organism: J18.9

## 2022-01-11 HISTORY — DX: Headache, unspecified: R51.9

## 2022-01-11 HISTORY — DX: Personal history of urinary calculi: Z87.442

## 2022-01-11 LAB — BASIC METABOLIC PANEL
Anion gap: 9 (ref 5–15)
BUN: 17 mg/dL (ref 8–23)
CO2: 24 mmol/L (ref 22–32)
Calcium: 9.9 mg/dL (ref 8.9–10.3)
Chloride: 105 mmol/L (ref 98–111)
Creatinine, Ser: 1.07 mg/dL — ABNORMAL HIGH (ref 0.44–1.00)
GFR, Estimated: 59 mL/min — ABNORMAL LOW (ref >60)
Glucose, Bld: 104 mg/dL — ABNORMAL HIGH (ref 70–99)
Potassium: 4 mmol/L (ref 3.5–5.1)
Sodium: 138 mmol/L (ref 135–145)

## 2022-01-11 LAB — CBC
HCT: 38.2 % (ref 36.0–46.0)
Hemoglobin: 12.3 g/dL (ref 12.0–15.0)
MCH: 29 pg (ref 26.0–34.0)
MCHC: 32.2 g/dL (ref 30.0–36.0)
MCV: 90.1 fL (ref 80.0–100.0)
Platelets: 680 10*3/uL — ABNORMAL HIGH (ref 150–400)
RBC: 4.24 MIL/uL (ref 3.87–5.11)
RDW: 15.6 % — ABNORMAL HIGH (ref 11.5–15.5)
WBC: 9.9 10*3/uL (ref 4.0–10.5)
nRBC: 0 % (ref 0.0–0.2)

## 2022-01-11 LAB — SURGICAL PCR SCREEN
MRSA, PCR: NEGATIVE
Staphylococcus aureus: NEGATIVE

## 2022-01-16 NOTE — Anesthesia Preprocedure Evaluation (Signed)
Anesthesia Evaluation  Patient identified by MRN, date of birth, ID band Patient awake    Reviewed: Allergy & Precautions, NPO status , Patient's Chart, lab work & pertinent test results  Airway Mallampati: II  TM Distance: >3 FB Neck ROM: Full    Dental  (+) Teeth Intact, Dental Advisory Given   Pulmonary neg pulmonary ROS,    Pulmonary exam normal breath sounds clear to auscultation       Cardiovascular hypertension, Pt. on medications + Past MI (2012)  Normal cardiovascular exam Rhythm:Regular Rate:Normal     Neuro/Psych  Headaches, negative psych ROS   GI/Hepatic negative GI ROS, Neg liver ROS,   Endo/Other  Hypothyroidism Obesity   Renal/GU Renal disease (LEFT URETERAL STONE)     Musculoskeletal negative musculoskeletal ROS (+)   Abdominal   Peds  Hematology negative hematology ROS (+)   Anesthesia Other Findings H/o breast cancer   Reproductive/Obstetrics                           Anesthesia Physical Anesthesia Plan  ASA: 2  Anesthesia Plan: General   Post-op Pain Management: Tylenol PO (pre-op)*   Induction: Intravenous  PONV Risk Score and Plan: 4 or greater and Midazolam, Dexamethasone and Ondansetron  Airway Management Planned: LMA  Additional Equipment:   Intra-op Plan:   Post-operative Plan: Extubation in OR  Informed Consent: I have reviewed the patients History and Physical, chart, labs and discussed the procedure including the risks, benefits and alternatives for the proposed anesthesia with the patient or authorized representative who has indicated his/her understanding and acceptance.     Dental advisory given  Plan Discussed with: CRNA  Anesthesia Plan Comments:        Anesthesia Quick Evaluation

## 2022-01-17 ENCOUNTER — Encounter (HOSPITAL_COMMUNITY)
Admission: RE | Disposition: A | Discharge status: Home / Self Care | Payer: Self-pay | Source: Home / Self Care | Attending: Urology

## 2022-01-17 ENCOUNTER — Ambulatory Visit (HOSPITAL_COMMUNITY)
Admission: RE | Admit: 2022-01-17 | Discharge: 2022-01-17 | Disposition: A | Discharge status: Home / Self Care | Payer: 59 | Attending: Urology | Admitting: Urology

## 2022-01-17 ENCOUNTER — Encounter (HOSPITAL_COMMUNITY): Payer: Self-pay | Admitting: Urology

## 2022-01-17 ENCOUNTER — Ambulatory Visit (HOSPITAL_BASED_OUTPATIENT_CLINIC_OR_DEPARTMENT_OTHER): Payer: 59 | Admitting: Anesthesiology

## 2022-01-17 ENCOUNTER — Ambulatory Visit (HOSPITAL_COMMUNITY): Payer: 59

## 2022-01-17 ENCOUNTER — Ambulatory Visit (HOSPITAL_COMMUNITY): Payer: 59 | Admitting: Physician Assistant

## 2022-01-17 DIAGNOSIS — K7460 Unspecified cirrhosis of liver: Secondary | ICD-10-CM | POA: Insufficient documentation

## 2022-01-17 DIAGNOSIS — I10 Essential (primary) hypertension: Secondary | ICD-10-CM | POA: Insufficient documentation

## 2022-01-17 DIAGNOSIS — E669 Obesity, unspecified: Secondary | ICD-10-CM | POA: Diagnosis not present

## 2022-01-17 DIAGNOSIS — N201 Calculus of ureter: Secondary | ICD-10-CM

## 2022-01-17 DIAGNOSIS — I252 Old myocardial infarction: Secondary | ICD-10-CM | POA: Insufficient documentation

## 2022-01-17 DIAGNOSIS — B961 Klebsiella pneumoniae [K. pneumoniae] as the cause of diseases classified elsewhere: Secondary | ICD-10-CM | POA: Insufficient documentation

## 2022-01-17 DIAGNOSIS — E039 Hypothyroidism, unspecified: Secondary | ICD-10-CM | POA: Diagnosis not present

## 2022-01-17 DIAGNOSIS — Z853 Personal history of malignant neoplasm of breast: Secondary | ICD-10-CM | POA: Insufficient documentation

## 2022-01-17 DIAGNOSIS — N3020 Other chronic cystitis without hematuria: Secondary | ICD-10-CM | POA: Insufficient documentation

## 2022-01-17 DIAGNOSIS — N132 Hydronephrosis with renal and ureteral calculous obstruction: Secondary | ICD-10-CM | POA: Insufficient documentation

## 2022-01-17 HISTORY — PX: CYSTOSCOPY/URETEROSCOPY/HOLMIUM LASER/STENT PLACEMENT: SHX6546

## 2022-01-17 SURGERY — CYSTOSCOPY/URETEROSCOPY/HOLMIUM LASER/STENT PLACEMENT
Anesthesia: General | Laterality: Left

## 2022-01-17 MED ORDER — ONDANSETRON HCL 4 MG PO TABS: 4.0000 mg | ORAL_TABLET | Freq: Every day | ORAL | 1 refills | Status: AC | PRN

## 2022-01-17 MED ORDER — ACETAMINOPHEN 500 MG PO TABS
1000.0000 mg | ORAL_TABLET | Freq: Once | ORAL | Status: AC
Start: 2022-01-17 — End: 2022-01-17
  Administered 2022-01-17: 1000 mg via ORAL
  Filled 2022-01-17: qty 2

## 2022-01-17 MED ORDER — MIDAZOLAM HCL 2 MG/2ML IJ SOLN
INTRAMUSCULAR | Status: DC | PRN
  Administered 2022-01-17: 2 mg via INTRAVENOUS

## 2022-01-17 MED ORDER — OXYCODONE-ACETAMINOPHEN 5-325 MG PO TABS: 1.0000 | ORAL_TABLET | ORAL | 0 refills | Status: AC | PRN

## 2022-01-17 MED ORDER — LIDOCAINE HCL (PF) 2 % IJ SOLN
INTRAMUSCULAR | Status: AC
  Filled 2022-01-17: qty 5

## 2022-01-17 MED ORDER — CIPROFLOXACIN HCL 500 MG PO TABS: 500.0000 mg | ORAL_TABLET | Freq: Two times a day (BID) | ORAL | 0 refills | Status: AC

## 2022-01-17 MED ORDER — CHLORHEXIDINE GLUCONATE 0.12 % MT SOLN
15.0000 mL | Freq: Once | OROMUCOSAL | Status: AC
  Administered 2022-01-17: 15 mL via OROMUCOSAL

## 2022-01-17 MED ORDER — MIDAZOLAM HCL 2 MG/2ML IJ SOLN
INTRAMUSCULAR | Status: AC
  Filled 2022-01-17: qty 2

## 2022-01-17 MED ORDER — SODIUM CHLORIDE 0.9 % IR SOLN
Status: DC | PRN
  Administered 2022-01-17: 3000 mL

## 2022-01-17 MED ORDER — PROPOFOL 10 MG/ML IV BOLUS
INTRAVENOUS | Status: DC | PRN
  Administered 2022-01-17: 160 mg via INTRAVENOUS

## 2022-01-17 MED ORDER — HYDRALAZINE HCL 20 MG/ML IJ SOLN
INTRAMUSCULAR | Status: AC
  Administered 2022-01-17: 10 mg via INTRAVENOUS
  Filled 2022-01-17: qty 1

## 2022-01-17 MED ORDER — ORAL CARE MOUTH RINSE: 15.0000 mL | Freq: Once | OROMUCOSAL | Status: AC

## 2022-01-17 MED ORDER — ONDANSETRON HCL 4 MG/2ML IJ SOLN
INTRAMUSCULAR | Status: DC | PRN
  Administered 2022-01-17: 4 mg via INTRAVENOUS

## 2022-01-17 MED ORDER — PROPOFOL 10 MG/ML IV BOLUS
INTRAVENOUS | Status: AC
  Filled 2022-01-17: qty 20

## 2022-01-17 MED ORDER — DEXAMETHASONE SODIUM PHOSPHATE 4 MG/ML IJ SOLN
INTRAMUSCULAR | Status: DC | PRN
  Administered 2022-01-17: 5 mg via INTRAVENOUS

## 2022-01-17 MED ORDER — LIDOCAINE 2% (20 MG/ML) 5 ML SYRINGE
INTRAMUSCULAR | Status: DC | PRN
  Administered 2022-01-17: 80 mg via INTRAVENOUS

## 2022-01-17 MED ORDER — PHENAZOPYRIDINE HCL 200 MG PO TABS: 200.0000 mg | ORAL_TABLET | Freq: Three times a day (TID) | ORAL | 0 refills | Status: AC | PRN

## 2022-01-17 MED ORDER — CIPROFLOXACIN IN D5W 400 MG/200ML IV SOLN
400.0000 mg | Freq: Once | INTRAVENOUS | Status: AC
  Administered 2022-01-17: 400 mg via INTRAVENOUS
  Filled 2022-01-17: qty 200

## 2022-01-17 MED ORDER — FENTANYL CITRATE (PF) 100 MCG/2ML IJ SOLN
INTRAMUSCULAR | Status: AC
  Filled 2022-01-17: qty 2

## 2022-01-17 MED ORDER — PROMETHAZINE HCL 25 MG/ML IJ SOLN: 6.2500 mg | INTRAMUSCULAR | Status: DC | PRN

## 2022-01-17 MED ORDER — ONDANSETRON HCL 4 MG/2ML IJ SOLN
INTRAMUSCULAR | Status: AC
  Filled 2022-01-17: qty 2

## 2022-01-17 MED ORDER — FENTANYL CITRATE (PF) 100 MCG/2ML IJ SOLN
INTRAMUSCULAR | Status: DC | PRN
  Administered 2022-01-17: 5 ug via INTRAVENOUS
  Administered 2022-01-17: 50 ug via INTRAVENOUS

## 2022-01-17 MED ORDER — OXYBUTYNIN CHLORIDE 5 MG PO TABS: 5.0000 mg | ORAL_TABLET | Freq: Three times a day (TID) | ORAL | 1 refills | Status: DC | PRN

## 2022-01-17 MED ORDER — HYDRALAZINE HCL 20 MG/ML IJ SOLN: 10.0000 mg | INTRAMUSCULAR | Status: DC | PRN

## 2022-01-17 MED ORDER — KETOROLAC TROMETHAMINE 15 MG/ML IJ SOLN
INTRAMUSCULAR | Status: DC | PRN
  Administered 2022-01-17: 30 mg via INTRAVENOUS

## 2022-01-17 MED ORDER — DEXAMETHASONE SODIUM PHOSPHATE 10 MG/ML IJ SOLN
INTRAMUSCULAR | Status: AC
  Filled 2022-01-17: qty 1

## 2022-01-17 MED ORDER — LACTATED RINGERS IV SOLN: INTRAVENOUS | Status: DC

## 2022-01-17 MED ORDER — FENTANYL CITRATE PF 50 MCG/ML IJ SOSY: 25.0000 ug | PREFILLED_SYRINGE | INTRAMUSCULAR | Status: DC | PRN

## 2022-01-17 MED ORDER — IOHEXOL 300 MG/ML  SOLN
INTRAMUSCULAR | Status: DC | PRN
  Administered 2022-01-17: 50 mL

## 2022-01-17 SURGICAL SUPPLY — 21 items
BAG URO CATCHER STRL LF (MISCELLANEOUS) ×1 IMPLANT
BASKET ZERO TIP NITINOL 2.4FR (BASKET) IMPLANT
CATH URETL OPEN 5X70 (CATHETERS) ×1 IMPLANT
CLOTH BEACON ORANGE TIMEOUT ST (SAFETY) ×1 IMPLANT
EXTRACTOR STONE NITINOL NGAGE (UROLOGICAL SUPPLIES) IMPLANT
GLOVE SURG LX STRL 7.5 STRW (GLOVE) ×1 IMPLANT
GOWN SRG XL LVL 4 BRTHBL STRL (GOWNS) ×1 IMPLANT
GOWN STRL NON-REIN XL LVL4 (GOWNS) ×1
GUIDEWIRE STR DUAL SENSOR (WIRE) IMPLANT
GUIDEWIRE ZIPWRE .038 STRAIGHT (WIRE) ×1 IMPLANT
KIT TURNOVER KIT A (KITS) ×1 IMPLANT
LASER FIB FLEXIVA PULSE ID 365 (Laser) IMPLANT
MANIFOLD NEPTUNE II (INSTRUMENTS) ×1 IMPLANT
PACK CYSTO (CUSTOM PROCEDURE TRAY) ×1 IMPLANT
SHEATH NAVIGATOR HD 11/13X36 (SHEATH) IMPLANT
STENT URET 6FRX24 CONTOUR (STENTS) IMPLANT
STENT URET 6FRX26 CONTOUR (STENTS) IMPLANT
TRACTIP FLEXIVA PULS ID 200XHI (Laser) IMPLANT
TRACTIP FLEXIVA PULSE ID 200 (Laser)
TUBING CONNECTING 10 (TUBING) ×1 IMPLANT
TUBING UROLOGY SET (TUBING) ×1 IMPLANT

## 2022-01-17 NOTE — H&P (Signed)
Urology Preoperative H&P   Chief Complaint: Left flank pain  History of Present Illness: [name] is a 61 y.o. female with who presented to the office on 01/04/22 with left flank pain, gross hematuria and general malaise.  CTSS from that day revealed a 4 mm left ureteral stone.  She was started on an empiric course of cipro with her eventual urine culture growing Klebsiella.  Since her initial evaluation, she has remained afebrile and she states that she has not seen a stone pass yet.     Past Medical History:  Diagnosis Date   Allergic rhinitis    Breast cancer ([place])    left breast ca, lumpectomy, chemotherapy, radiation.  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   Pneumonia    with COVID    Past Surgical History:  Procedure Laterality Date   APPENDECTOMY     BREAST BIOPSY  04/30/1994   right   BREAST LUMPECTOMY  04/30/2001   Left   COLONOSCOPY N/A 03/09/2013   Procedure: COLONOSCOPY;  Surgeon: Gatha Mayer, MD;  Location: [place];  Service: Endoscopy;  Laterality: N/A;   HIPEC  08/06/2013   LAPAROSCOPIC PARTIAL COLECTOMY N/A 03/10/2013   Procedure: LAPAROSCOPIC PARTIAL COLECTOMY;  Surgeon: Joyice Faster. Cornett, MD;  Location: [place];  Service: General;  Laterality: N/A;   OOPHORECTOMY     BSO   TOTAL ABDOMINAL HYSTERECTOMY  04/30/2006   Supracervical with BSO    Allergies:  Allergies  Allergen Reactions   Penicillins Swelling and Rash    Family History  Problem Relation Age of Onset   Diabetes Mother    Arthritis  Mother    Clotting disorder Mother    Breast cancer Mother 34   Heart attack Brother    Hypertension Father    Breast cancer Paternal Aunt 32   Stroke Brother    Heart failure Brother    Clotting disorder Niece     Social History:  reports that she has never smoked. She has never used smokeless tobacco. She reports that she does not currently use alcohol. She reports that she does not use drugs.  ROS: A complete review of systems was performed.  All systems are negative except for pertinent findings as noted.  Physical Exam:  Vital signs in last 24 hours: Temp:  [98.1 F (36.7 C)] 98.1 F (36.7 C) (09/20 0622) Pulse Rate:  [83] 83 (09/20 0622) Resp:  [16] 16 (09/20 0622) SpO2:  [96 %] 96 % (09/20 0622) Weight:  [97.5 kg] 97.5 kg (09/20 0615) Constitutional:  Alert and oriented, No acute distress Cardiovascular: Regular rate and rhythm, No JVD Respiratory: Normal respiratory effort, Lungs clear bilaterally GI: Abdomen is soft, nontender, nondistended, no abdominal masses GU: No CVA tenderness Lymphatic: No lymphadenopathy Neurologic: Grossly intact, no focal deficits Psychiatric: Normal mood and affect  Laboratory Data:  No results for input(s): "WBC", "HGB", "HCT", "PLT" in the last 72 hours.  No results for input(s): "NA", "K", "CL", "GLUCOSE", "BUN", "CALCIUM", "  CREATININE" in the last 72 hours.  Invalid input(s): "CO3"   No results found for this or any previous visit (from the past 24 hour(s)). Recent Results (from the past 240 hour(s))  Surgical pcr screen     Status: None   Collection Time: 01/11/22  8:57 AM   Specimen: Nasal Mucosa; Nasal Swab  Result Value Ref Range Status   MRSA, PCR NEGATIVE NEGATIVE Final   Staphylococcus aureus NEGATIVE NEGATIVE Final    Comment: (NOTE) The Xpert SA Assay (FDA approved for NASAL specimens in patients 58 years of age and older), is one component of a comprehensive surveillance program. It is not intended to diagnose  infection nor to guide or monitor treatment. Performed at [facility], [place] [address], [place], [place] [address]     Renal Function: Recent Labs    01/11/22 0857  CREATININE 1.07*   Estimated Creatinine Clearance: 66.2 mL/min (A) (by C-G formula based on SCr of 1.07 mg/dL (H)).  Radiologic Imaging: CLINICAL DATA:  Chronic cystitis. Left flank and lower quadrant  pain. Fever. Nephrolithiasis.     EXAM:  CT ABDOMEN AND PELVIS WITHOUT CONTRAST     TECHNIQUE:  Multidetector CT imaging of the abdomen and pelvis was performed  following the standard protocol without IV contrast.     RADIATION DOSE REDUCTION: This exam was performed according to the  departmental dose-optimization program which includes automated  exposure control, adjustment of the mA and/or kV according to  patient size and/or use of iterative reconstruction technique.     COMPARISON:  11/08/2014from [facility]     FINDINGS:  Lower chest: No acute findings.     Hepatobiliary: Hepatic cirrhosis shows significant progression since  previous study. No hepatic mass visualized on this unenhanced exam.  Gallbladder is unremarkable. No evidence of biliary ductal  dilatation.     Pancreas: No mass or inflammatory process visualized on this  unenhanced exam.     Spleen:  Patient has undergone splenectomy since previous study.     Adrenals/Urinary tract: Mild left hydroureteronephrosis is seen due  to a 4 mm calculus in the distal left ureter adjacent to the UVJ.     Stomach/Bowel: No evidence of obstruction, inflammatory process, or  abnormal fluid collections. A small midline epigastric ventral  hernia is seen which contains a loop of transverse colon no evidence  of bowel obstruction or strangulation.     Vascular/Lymphatic: No pathologically enlarged lymph nodes  identified. No evidence of abdominal aortic aneurysm.     Reproductive:  No mass or other significant  abnormality.     Other:  None.     Musculoskeletal:  No suspicious bone lesions identified.     IMPRESSION:  Mild left hydroureteronephrosis due to 4 mm distal left ureteral  calculus adjacent to the UVJ.     Hepatic cirrhosis.  Prior splenomegaly.     Small epigastric ventral hernia containing a loop of transverse  colon. No evidence of bowel obstruction or strangulation.        Electronically Signed    By: Marlaine Hind M.D.    On: 01/05/2022 15:41   I independently reviewed the above imaging studies.  Assessment and Plan NICLOE FRONTERA is a 61 y.o. female with a 4 mm left ureteral stone   The risks, benefits and alternatives of cystoscopy with LEFT ureteroscopy, laser lithotripsy and ureteral stent placement was discussed the patient.  Risks included, but are not limited to: bleeding, urinary tract infection,  ureteral injury/avulsion, ureteral stricture formation, retained stone fragments, the possibility that multiple surgeries may be required to treat the stone(s), MI, stroke, PE and the inherent risks of general anesthesia.  The patient voices understanding and wishes to proceed.      Ellison Hughs, MD 01/17/2022, 8:10 AM  [facility] Pager: [phone]

## 2022-01-17 NOTE — Anesthesia Postprocedure Evaluation (Signed)
Anesthesia Post Note  Patient: [name]  Procedure(s) Performed: Valetta Fuller, Dorene Ar LASER, STENT PLACEMENT (Left)     Patient location during evaluation: PACU Anesthesia Type: General Level of consciousness: awake and alert Pain management: pain level controlled Vital Signs Assessment: post-procedure vital signs reviewed and stable Respiratory status: spontaneous breathing, nonlabored ventilation, respiratory function stable and patient connected to nasal cannula oxygen Cardiovascular status: blood pressure returned to baseline and stable Postop Assessment: no apparent nausea or vomiting Anesthetic complications: no   No notable events documented.  Last Vitals:  Vitals:   01/17/22 0945 01/17/22 0958  BP: (!) 168/103 (!) 162/90  Pulse: 87 97  Resp: 17 16  Temp:  36.4 C  SpO2: 94% 96%    Last Pain:  Vitals:   01/17/22 0958  TempSrc: Oral  PainSc: 0-No pain                 Santa Lighter

## 2022-01-17 NOTE — Anesthesia Procedure Notes (Signed)
Procedure Name: LMA Insertion Date/Time: 01/17/2022 8:40 AM  Performed by: Claudia Desanctis, CRNAPre-anesthesia Checklist: Emergency Drugs available, Patient identified, Suction available and Patient being monitored Patient Re-evaluated:Patient Re-evaluated prior to induction Oxygen Delivery Method: Circle system utilized Preoxygenation: Pre-oxygenation with 100% oxygen Induction Type: IV induction Ventilation: Mask ventilation without difficulty LMA: LMA inserted LMA Size: 4.0 Number of attempts: 1 Placement Confirmation: positive ETCO2 and breath sounds checked- equal and bilateral Tube secured with: Tape Dental Injury: Teeth and Oropharynx as per pre-operative assessment

## 2022-01-17 NOTE — Transfer of Care (Signed)
Immediate Anesthesia Transfer of Care Note  Patient: TELETHA PETREA  Procedure(s) Performed: Valetta Fuller, Dorene Ar LASER, STENT PLACEMENT (Left)  Patient Location: PACU  Anesthesia Type:General  Level of Consciousness: awake, alert , oriented and patient cooperative  Airway & Oxygen Therapy: Patient Spontanous Breathing and Patient connected to face mask  Post-op Assessment: Report given to RN and Post -op Vital signs reviewed and stable  Post vital signs: Reviewed and stable  Last Vitals:  Vitals Value Taken Time  BP 182/103 01/17/22 0912  Temp    Pulse 89 01/17/22 0913  Resp 22 01/17/22 0913  SpO2 95 % 01/17/22 0913  Vitals shown include unvalidated device data.  Last Pain:  Vitals:   01/17/22 0703  TempSrc:   PainSc: 2       Patients Stated Pain Goal: 1 (59/97/74 1423)  Complications: No notable events documented.

## 2022-01-17 NOTE — Op Note (Signed)
Operative Note  Preoperative diagnosis:  1.  4 mm left distal ureteral stone  Postoperative diagnosis: 1.  4 mm left distal ureteral stone  Procedure(s): 1.  Cystoscopy with left ureteroscopy, holmium laser lithotripsy and left JJ stent placement 2.  Left retrograde pyelogram with intraoperative interpretation of fluoroscopic imaging  Surgeon: Ellison Hughs, MD  Assistants:  None  Anesthesia:  General  Complications:  None  EBL: Less than 5 mL  Specimens: 1.  Left ureteral stone fragments  Drains/Catheters: 1.  Left 6 French, 24 cm JJ stent without tether  Intraoperative findings:   Left retrograde pyelogram revealed a filling defect within the distal aspects of the left ureter, consistent with the stone seen on recent CT.  There was minimal dilation of the left ureter and renal pelvis.  There were no filling defects seen within the upper urinary tract.  Indication:  [name] is a 61 y.o. female with a 4 mm left distal ureteral stone along with a Klebsiella UTI.  She has been consented for the above procedures, voices understanding and wishes to proceed.  Description of procedure:  After informed consent was obtained, the patient was brought to the operating room and general LMA anesthesia was administered. The patient was then placed in the dorsolithotomy position and prepped and draped in the usual sterile fashion. A timeout was performed. A 23 French rigid cystoscope was then inserted into the urethral meatus and advanced into the bladder under direct vision. A complete bladder survey revealed no intravesical pathology.  A 5 French ureteral catheter was then inserted into the left ureteral orifice and a retrograde pyelogram was obtained, with the findings listed above.  A Glidewire was then used to intubate the lumen of the ureteral catheter and was advanced up to the left renal pelvis, under fluoroscopic guidance.  The catheter was then removed, leaving the wire in  place.  A semirigid ureteroscope was then advanced into the distal aspects of the left ureter where her stone was identified.  A 200 m holmium laser was used to fracture the stone into several smaller pieces.  A 0 tip basket was then used to extract all stone fragments from the lumen of the left ureter.  A 6 French, 24 cm JJ stent was then advanced over the wire and into good position within the left collecting system, confirming placement via fluoroscopy.  The patient's bladder was drained and all stone fragments were evacuated through the sheath of the cystoscope.  She tolerated the procedure well and was transferred to the postanesthesia in stable condition.  Plan: Follow-up in 1 week for office cystoscopy and stent removal

## 2022-01-18 ENCOUNTER — Encounter (HOSPITAL_COMMUNITY): Payer: Self-pay | Admitting: Urology

## 2022-02-08 NOTE — Progress Notes (Deleted)
61 y.o. G0P0 Married {Race/ethnicity:[id number]} female here for annual exam.    PCP:     Domenick Gong, MD  No LMP recorded. Patient has had a hysterectomy.           Sexually active: {yes no:314532}  The current method of family planning is status post supracervical hysterectomy.    Exercising: {yes no:314532}  {types:19826} Smoker:  {YES NO:22349}  Health Maintenance: Pap: 02-04-20 Neg, 01-25-17 Neg, 01-19-14 Neg History of abnormal Pap:  remote history of abnormal pap with Dr. Ubaldo Glassing. No colposcopy.  Follow ups normal.  This was about 30 years ago.  MMG:  04/10/21 density C Bi-rads 2 benign  Colonoscopy:  2021 f/u 5 years  BMD:03-04-18  Result :Osteoporosis--declines treatment TDaP: 01-29-19 Gardasil:   no HIV: No Hep C:2015 Neg Screening Labs:  PCP.   reports that she has never smoked. She has never used smokeless tobacco. She reports that she does not currently use alcohol. She reports that she does not use drugs.  Past Medical History:  Diagnosis Date   Allergic rhinitis    Breast cancer ([place])    left breast ca, lumpectomy, chemotherapy, radiation.  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   Pneumonia    with COVID    Past Surgical History:  Procedure Laterality Date   APPENDECTOMY     BREAST BIOPSY  04/30/1994   right   BREAST LUMPECTOMY  04/30/2001   Left   COLONOSCOPY N/A 03/09/2013   Procedure: COLONOSCOPY;  Surgeon: Gatha Mayer, MD;  Location: [place];  Service: Endoscopy;  Laterality: N/A;    CYSTOSCOPY/URETEROSCOPY/HOLMIUM LASER/STENT PLACEMENT Left 01/17/2022   Procedure: Valetta Fuller Waynetta Pean, STENT PLACEMENT;  Surgeon: Ceasar Mons, MD;  Location: [facility];  Service: Urology;  Laterality: Left;  ONLY NEEDS 30 MIN   HIPEC  08/06/2013   LAPAROSCOPIC PARTIAL COLECTOMY N/A 03/10/2013   Procedure: LAPAROSCOPIC PARTIAL COLECTOMY;  Surgeon: Joyice Faster. Cornett, MD;  Location: [place];  Service: General;  Laterality: N/A;   OOPHORECTOMY     BSO   TOTAL ABDOMINAL HYSTERECTOMY  04/30/2006   Supracervical with BSO    Current Outpatient Medications  Medication Sig Dispense Refill   acetaminophen-caffeine (EXCEDRIN TENSION HEADACHE) 500-65 MG TABS per tablet Take 1 tablet by mouth every 6 (six) hours as needed (headaches).     aspirin-acetaminophen-caffeine (EXCEDRIN MIGRAINE) 250-250-65 MG tablet Take 1 tablet by mouth every 6 (six) hours as needed for headache.     CHLOROPHYLL PO Take 30 mLs by mouth at bedtime.     levothyroxine (SYNTHROID) 50 MCG tablet Take 50 mcg by mouth daily before breakfast.     losartan (COZAAR) 50 MG tablet Take 50 mg by mouth daily.     ondansetron (ZOFRAN) 4 MG tablet Take 1 tablet (4 mg total) by mouth daily as needed for nausea or vomiting. 30 tablet 1   oxybutynin (DITROPAN) 5 MG tablet Take 1 tablet (5 mg total) by mouth every 8 (eight) hours as needed  for bladder spasms. 30 tablet 1   oxyCODONE-acetaminophen (PERCOCET) 5-325 MG tablet Take 1 tablet by mouth every 4 (four) hours as needed for severe pain. 20 tablet 0   phenazopyridine (PYRIDIUM) 200 MG tablet Take 1 tablet (200 mg total) by mouth 3 (three) times daily as needed (for pain with urination). 30 tablet 0   No current facility-administered medications for this visit.    Family History  Problem Relation Age of Onset   Diabetes Mother    Arthritis Mother    Clotting disorder Mother    Breast cancer Mother 50   Heart attack Brother    Hypertension Father    Breast  cancer Paternal Aunt 19   Stroke Brother    Heart failure Brother    Clotting disorder Niece     Review of Systems  Exam:   There were no vitals taken for this visit.    General appearance: alert, cooperative and appears stated age Head: normocephalic, without obvious abnormality, atraumatic Neck: no adenopathy, supple, symmetrical, trachea midline and thyroid normal to inspection and palpation Lungs: clear to auscultation bilaterally Breasts: normal appearance, no masses or tenderness, No nipple retraction or dimpling, No nipple discharge or bleeding, No axillary adenopathy Heart: regular rate and rhythm Abdomen: soft, non-tender; no masses, no organomegaly Extremities: extremities normal, atraumatic, no cyanosis or edema Skin: skin color, texture, turgor normal. No rashes or lesions Lymph nodes: cervical, supraclavicular, and axillary nodes normal. Neurologic: grossly normal  Pelvic: External genitalia:  no lesions              No abnormal inguinal nodes palpated.              Urethra:  normal appearing urethra with no masses, tenderness or lesions              Bartholins and Skenes: normal                 Vagina: normal appearing vagina with normal color and discharge, no lesions              Cervix: no lesions              Pap taken: {yes no:314532} Bimanual Exam:  Uterus:  normal size, contour, position, consistency, mobility, non-tender              Adnexa: no mass, fullness, tenderness              Rectal exam: {yes no:314532}.  Confirms.              Anus:  normal sphincter tone, no lesions  Chaperone was present for exam:  ***  Assessment:   Well woman visit with gynecologic exam.   Plan: Mammogram screening discussed. Self breast awareness reviewed. Pap and HR HPV as above. Guidelines for Calcium, Vitamin D, regular exercise program including cardiovascular and weight bearing exercise.   Follow up annually and prn.   Additional counseling given.  {yes  [id number]. _______ minutes face to face time of which over 50% was spent in counseling.    After visit summary provided.

## 2022-02-13 ENCOUNTER — Other Ambulatory Visit: Payer: Self-pay | Admitting: Obstetrics and Gynecology

## 2022-02-13 DIAGNOSIS — Z1231 Encounter for screening mammogram for malignant neoplasm of breast: Secondary | ICD-10-CM

## 2022-02-15 ENCOUNTER — Ambulatory Visit: Payer: 59 | Admitting: Obstetrics and Gynecology

## 2022-03-29 ENCOUNTER — Ambulatory Visit: Payer: 59 | Admitting: Obstetrics and Gynecology

## 2022-04-02 NOTE — Progress Notes (Signed)
61 y.o. G0P0 Married Caucasian female here for annual exam.    No GYN concerns.   Had surgery to remove a kidney stone in September.   Now has dx of HTN.   She has declined tx for osteoporosis in past.   Husband dx with bladder cancer Doing well.   PCP:   Rowan Blase, PA-C  No LMP recorded. Patient has had a hysterectomy.           Sexually active: No.  The current method of family planning is status post supracervical hysterectomy.    Exercising: Yes.    Home exercise routine includes walking 1 hrs per day. Smoker:  no  Health Maintenance: Pap:  02-04-20 Neg, 01-25-17 Neg, 01-19-14 Neg  History of abnormal Pap:  yes, remote history of abnormal pap with Dr. Ubaldo Glassing. No colposcopy. Follow ups normal. This was about 30 years ago.  MMG:  04/03/22, Breast Density Category C, BI-RADS CATEGORY 2  Beniggn Colonoscopy:  03/09/13, normal.  2021 - normal - Dr. Earlean Shawl.   Due in 2026  BMD:   03/04/18  Result  osteoporosis- declines treatment.   TDaP:  01/29/19 Gardasil:   no HIV: n/a Hep C: n/a Screening Labs:  PCP   reports that she has never smoked. She has never used smokeless tobacco. She reports that she does not currently use alcohol. She reports that she does not use drugs.  Past Medical History:  Diagnosis Date   Allergic rhinitis    Breast cancer ([place])    left breast ca, lumpectomy, chemotherapy, radiation.  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   Pneumonia    with COVID    Past Surgical History:  Procedure Laterality Date    APPENDECTOMY     BREAST BIOPSY  04/30/1994   right   BREAST LUMPECTOMY  04/30/2001   Left   COLONOSCOPY N/A 03/09/2013   Procedure: COLONOSCOPY;  Surgeon: Gatha Mayer, MD;  Location: [facility];  Service: Endoscopy;  Laterality: N/A;   CYSTOSCOPY/URETEROSCOPY/HOLMIUM LASER/STENT PLACEMENT Left 01/17/2022   Procedure: Valetta Fuller Waynetta Pean, STENT PLACEMENT;  Surgeon: Ceasar Mons, MD;  Location[facility];  Service: Urology;  Laterality: Left;  ONLY NEEDS 30 MIN   HIPEC  08/06/2013   LAPAROSCOPIC PARTIAL COLECTOMY N/A 03/10/2013   Procedure: LAPAROSCOPIC PARTIAL COLECTOMY;  Surgeon: Joyice Faster. Cornett, MD;  Location: [place];  Service: General;  Laterality: N/A;   OOPHORECTOMY     BSO   TOTAL ABDOMINAL HYSTERECTOMY  04/30/2006   Supracervical with BSO    Current Outpatient Medications  Medication Sig Dispense Refill   acetaminophen-caffeine (EXCEDRIN TENSION HEADACHE) 500-65 MG TABS per tablet Take 1 tablet by mouth every 6 (six) hours as needed (headaches).     amLODipine (NORVASC) 5 MG tablet Take 5 mg by mouth daily.     aspirin-acetaminophen-caffeine (EXCEDRIN MIGRAINE) 250-250-65 MG tablet Take 1 tablet by mouth every 6 (six) hours as needed for headache.     CHLOROPHYLL PO Take 30 mLs  by mouth at bedtime.     levothyroxine (SYNTHROID) 50 MCG tablet Take 50 mcg by mouth daily before breakfast.     lisinopril (ZESTRIL) 40 MG tablet Take 40 mg by mouth daily. Wednesday and Sunday take 75 mg     losartan (COZAAR) 50 MG tablet Take 50 mg by mouth daily. (Patient not taking: Reported on 04/09/2022)     ondansetron (ZOFRAN) 4 MG tablet Take 1 tablet (4 mg total) by mouth daily as needed for nausea or vomiting. (Patient not taking: Reported on 04/09/2022) 30 tablet 1   oxybutynin (DITROPAN) 5 MG tablet Take 1 tablet (5 mg total) by mouth every 8 (eight) hours as needed for bladder spasms. (Patient not taking: Reported on 04/09/2022) 30 tablet 1    oxyCODONE-acetaminophen (PERCOCET) 5-325 MG tablet Take 1 tablet by mouth every 4 (four) hours as needed for severe pain. (Patient not taking: Reported on 04/09/2022) 20 tablet 0   phenazopyridine (PYRIDIUM) 200 MG tablet Take 1 tablet (200 mg total) by mouth 3 (three) times daily as needed (for pain with urination). (Patient not taking: Reported on 04/09/2022) 30 tablet 0   No current facility-administered medications for this visit.    Family History  Problem Relation Age of Onset   Diabetes Mother    Arthritis Mother    Clotting disorder Mother    Breast cancer Mother 60   Heart attack Brother    Hypertension Father    Breast cancer Paternal Aunt 64   Stroke Brother    Heart failure Brother    Clotting disorder Niece     Review of Systems  All other systems reviewed and are negative.   Exam:   BP 124/86 (BP Location: Left Arm, Patient Position: Sitting, Cuff Size: Large)   Pulse 74   Ht 5' 7.5" (1.715 m)   Wt 223 lb (101.2 kg)   SpO2 98%   BMI 34.41 kg/m     General appearance: alert, cooperative and appears stated age Head: normocephalic, without obvious abnormality, atraumatic Neck: no adenopathy, supple, symmetrical, trachea midline and thyroid normal to inspection and palpation Lungs: clear to auscultation bilaterally Breasts: normal appearance, no masses or tenderness, No nipple retraction or dimpling, No nipple discharge or bleeding, No axillary adenopathy Heart: regular rate and rhythm Abdomen: soft, non-tender; ventral hernia present, no organomegaly Extremities: extremities normal, atraumatic, no cyanosis or edema Skin: skin color, texture, turgor normal. No rashes or lesions Lymph nodes: cervical, supraclavicular, and axillary nodes normal. Neurologic: grossly normal  Pelvic: External genitalia:  no lesions              No abnormal inguinal nodes palpated.              Urethra:  normal appearing urethra with no masses, tenderness or lesions               Bartholins and Skenes: normal                 Vagina: normal appearing vagina with normal color and discharge, no lesions              Cervix: no lesions              Pap taken: no Bimanual Exam:  Uterus:  absent              Adnexa: no mass, fullness, tenderness              Rectal exam: yes.  Confirms.  Anus:  normal sphincter tone, no lesions  Chaperone was present for exam:  Raquel Sarna.  Assessment:   Well woman visit with gynecologic exam. Status post supracervical hysterectomy with BSO 2008.  Hx appendiceal cancer.  Status post hemicolectomy.  Ventral hernia.  Hx left breast cancer.  Osteoporosis.  Hx MI.  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   Had hepatic vein thrombosis.  Tx with Lovenox for 3 years.  Vaginal atrophy.   Plan: Mammogram screening discussed. Self breast awareness reviewed. Pap and HR HPV 2024. Guidelines for Calcium, Vitamin D, regular exercise program including cardiovascular and weight bearing exercise. Will schedule BMD.  Patient is not certain if she wants to treat.  Follow up annually and prn.   After visit summary provided.

## 2022-04-03 ENCOUNTER — Ambulatory Visit
Admission: RE | Admit: 2022-04-03 | Discharge: 2022-04-03 | Disposition: A | Discharge status: Home / Self Care | Payer: 59 | Source: Ambulatory Visit | Attending: Obstetrics and Gynecology | Admitting: Obstetrics and Gynecology

## 2022-04-03 DIAGNOSIS — Z1231 Encounter for screening mammogram for malignant neoplasm of breast: Secondary | ICD-10-CM

## 2022-04-09 ENCOUNTER — Encounter: Payer: Self-pay | Admitting: Obstetrics and Gynecology

## 2022-04-09 ENCOUNTER — Ambulatory Visit (INDEPENDENT_AMBULATORY_CARE_PROVIDER_SITE_OTHER): Payer: 59 | Admitting: Obstetrics and Gynecology

## 2022-04-09 VITALS — BP 124/86 | HR 74 | Ht 67.5 in | Wt 223.0 lb

## 2022-04-09 DIAGNOSIS — M810 Age-related osteoporosis without current pathological fracture: Secondary | ICD-10-CM

## 2022-04-09 DIAGNOSIS — Z01419 Encounter for gynecological examination (general) (routine) without abnormal findings: Secondary | ICD-10-CM | POA: Diagnosis not present

## 2022-04-09 NOTE — Patient Instructions (Signed)

## 2022-04-10 ENCOUNTER — Other Ambulatory Visit: Payer: Self-pay

## 2022-04-10 DIAGNOSIS — M810 Age-related osteoporosis without current pathological fracture: Secondary | ICD-10-CM

## 2022-04-11 ENCOUNTER — Other Ambulatory Visit: Payer: Self-pay | Admitting: Obstetrics and Gynecology

## 2022-04-11 ENCOUNTER — Encounter (INDEPENDENT_AMBULATORY_CARE_PROVIDER_SITE_OTHER): Payer: 59

## 2022-04-11 DIAGNOSIS — Z780 Asymptomatic menopausal state: Secondary | ICD-10-CM | POA: Diagnosis not present

## 2022-04-11 DIAGNOSIS — Z13820 Encounter for screening for osteoporosis: Secondary | ICD-10-CM | POA: Diagnosis not present

## 2022-04-11 DIAGNOSIS — M810 Age-related osteoporosis without current pathological fracture: Secondary | ICD-10-CM

## 2023-01-24 ENCOUNTER — Other Ambulatory Visit: Payer: Self-pay | Admitting: Obstetrics and Gynecology

## 2023-01-24 DIAGNOSIS — Z1231 Encounter for screening mammogram for malignant neoplasm of breast: Secondary | ICD-10-CM

## 2023-04-08 ENCOUNTER — Ambulatory Visit
Admission: RE | Admit: 2023-04-08 | Discharge: 2023-04-08 | Disposition: A | Discharge status: Home / Self Care | Payer: 59 | Source: Ambulatory Visit | Attending: Obstetrics and Gynecology | Admitting: Obstetrics and Gynecology

## 2023-04-08 DIAGNOSIS — Z1231 Encounter for screening mammogram for malignant neoplasm of breast: Secondary | ICD-10-CM

## 2024-03-16 ENCOUNTER — Other Ambulatory Visit: Payer: Self-pay | Admitting: Obstetrics and Gynecology

## 2024-03-16 DIAGNOSIS — Z1231 Encounter for screening mammogram for malignant neoplasm of breast: Secondary | ICD-10-CM

## 2024-04-27 ENCOUNTER — Ambulatory Visit
Admission: RE | Admit: 2024-04-27 | Discharge: 2024-04-27 | Disposition: A | Discharge status: Home / Self Care | Source: Ambulatory Visit | Attending: Obstetrics and Gynecology | Admitting: Obstetrics and Gynecology

## 2024-04-27 DIAGNOSIS — Z1231 Encounter for screening mammogram for malignant neoplasm of breast: Secondary | ICD-10-CM

## 2024-05-01 ENCOUNTER — Other Ambulatory Visit: Payer: Self-pay | Admitting: Obstetrics and Gynecology

## 2024-05-01 DIAGNOSIS — R928 Other abnormal and inconclusive findings on diagnostic imaging of breast: Secondary | ICD-10-CM

## 2024-05-14 ENCOUNTER — Ambulatory Visit
Admission: RE | Admit: 2024-05-14 | Discharge: 2024-05-14 | Disposition: A | Source: Ambulatory Visit | Attending: Obstetrics and Gynecology | Admitting: Obstetrics and Gynecology

## 2024-05-14 ENCOUNTER — Other Ambulatory Visit: Payer: Self-pay | Admitting: Obstetrics and Gynecology

## 2024-05-14 DIAGNOSIS — N6322 Unspecified lump in the left breast, upper inner quadrant: Secondary | ICD-10-CM

## 2024-05-14 DIAGNOSIS — R928 Other abnormal and inconclusive findings on diagnostic imaging of breast: Secondary | ICD-10-CM

## 2024-05-14 HISTORY — PX: BREAST BIOPSY: SHX20

## 2024-05-15 LAB — SURGICAL PATHOLOGY

## 2024-05-22 ENCOUNTER — Ambulatory Visit: Payer: Self-pay | Admitting: Surgery

## 2024-05-27 ENCOUNTER — Other Ambulatory Visit: Payer: Self-pay

## 2024-05-27 ENCOUNTER — Encounter (HOSPITAL_BASED_OUTPATIENT_CLINIC_OR_DEPARTMENT_OTHER): Payer: Self-pay | Admitting: Surgery

## 2024-05-27 NOTE — Therapy (Unsigned)
 " OUTPATIENT PHYSICAL THERAPY BREAST CANCER BASELINE EVALUATION   Patient Name: [name] MRN: [id number] DOB:November 03, 1960, 64 y.o., female Today's Date: 05/28/2024  END OF SESSION:  PT End of Session - 05/28/24 0845     Visit Number 1    Number of Visits 2    Date for Recertification  07/09/24    PT Start Time 0800    PT Stop Time 0840    PT Time Calculation (min) 40 min    Activity Tolerance Patient tolerated treatment well    Behavior During Therapy [facility] for tasks assessed/performed          Past Medical History:  Diagnosis Date   Allergic rhinitis    Breast cancer ([facility])    left breast ca, lumpectomy, chemotherapy, radiation.  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   Pneumonia    with COVID   Past Surgical History:  Procedure Laterality Date   APPENDECTOMY     BREAST BIOPSY  04/30/1994   right   BREAST BIOPSY Left 05/14/2024   US  LT BREAST BX W LOC DEV 1ST LESION IMG BX SPEC US  GUIDE 05/14/2024 [facility] MAMMOGRAPHY   BREAST LUMPECTOMY  04/30/2001   Left   COLONOSCOPY N/A 03/09/2013   Procedure: COLONOSCOPY;  Surgeon: Lupita FORBES Commander, MD;  Location: [facility];  Service: Endoscopy;  Laterality: N/A;   CYSTOSCOPY/URETEROSCOPY/HOLMIUM LASER/STENT PLACEMENT Left 01/17/2022   Procedure: PHYLLIS SCHOOLS CORRIN DIEGO, STENT PLACEMENT;  Surgeon: Devere Lonni Righter, MD;  [facility];  Service: Urology;  Laterality: Left;  ONLY NEEDS 30 MIN   HIPEC  08/06/2013   LAPAROSCOPIC PARTIAL COLECTOMY  N/A 03/10/2013   Procedure: LAPAROSCOPIC PARTIAL COLECTOMY;  Surgeon: Debby LABOR. 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  Prior hx of left breast cancer with lumpectomy chemotherapy and radiation in 2003.  Pt will be having a simple mastectomy with SLNB on 06/03/24.  Hx of appendix cancer in 2014 with surgery and HIPAC chemotherapy.    PATIENT GOALS:   reduce lymphedema risk and learn post op HEP.   PAIN:  Are you having pain? No  PRECAUTIONS: Active CA  RED FLAGS: None   HAND DOMINANCE: right  WEIGHT BEARING RESTRICTIONS: No  FALLS:  Has  patient fallen in last 6 months? No  LIVING ENVIRONMENT: Patient lives with: husband   OCCUPATION: retired   LEISURE: sewing, golf   PRIOR LEVEL OF FUNCTION: Independent   OBJECTIVE: Note: Objective measures were completed at Evaluation unless otherwise noted.  COGNITION: Overall cognitive status: Within functional limits for tasks assessed    POSTURE:  Forward head and rounded shoulders posture  UPPER EXTREMITY AROM/PROM:  A/PROM RIGHT   eval   Shoulder extension 75  Shoulder flexion 160  Shoulder abduction 162  Shoulder internal rotation 70  Shoulder external rotation 95    (Blank rows = not tested)  A/PROM LEFT   eval  Shoulder extension 67  Shoulder flexion 162  Shoulder abduction 162  Shoulder internal rotation 70  Shoulder external rotation 80    (Blank rows = not tested)  CERVICAL AROM: All within normal limits:   UPPER EXTREMITY STRENGTH: 5/5 bil   LYMPHEDEMA ASSESSMENTS (in cm):   LANDMARK RIGHT   eval  10 cm proximal to olecranon process from proximal aspect of olecranon 39.2  Olecranon process 31.5  10 cm proximal to ulnar styloid process from proximal aspect of styloid process 28  Just distal to ulnar styloid process 18.3  Across hand at thumb web space 21.5  At base of 2nd digit 7.3  (Blank rows = not tested)  LANDMARK Rt eval  10 cm proximal to olecranon process from proximal aspect of olecranon 38.5   Olecranon process 32  10 cm proximal to ulnar styloid process from proximal aspect of styloid process 27.4  Just distal to ulnar styloid process 18.4  Across hand at thumb web space 20.3  At base of 2nd digit 7.2  (Blank rows = not tested)  L-DEX LYMPHEDEMA SCREENING: The patient was assessed using the L-Dex machine today to produce a lymphedema index baseline score. The patient will be reassessed on a regular basis (typically every 3 months) to obtain new L-Dex scores. If the score is > 6.5 points away from his/her baseline score  indicating onset of subclinical lymphedema, it will be recommended to wear a compression garment for 4 weeks, 12 hours per day and then be reassessed. If the score continues to be > 6.5 points from baseline at reassessment, we will initiate lymphedema treatment. Assessing in this manner has a 95% rate of preventing clinically significant lymphedema.   L-DEX FLOWSHEETS - 05/28/24 0800       L-DEX LYMPHEDEMA SCREENING   Measurement Type Unilateral    L-DEX MEASUREMENT EXTREMITY Upper Extremity    POSITION  Standing    DOMINANT SIDE Right    At Risk Side Left    BASELINE SCORE (UNILATERAL) 4.4    Comment hx of left breast cancer with SLNB in 2003  QUICK DASH SURVEY: 0%  PATIENT EDUCATION:  Education details: Time spent educating patient on aspects of self-care to maximize post op recovery. Patient was educated on where and how to get a post op compression bra to use to reduce post op edema. Patient was also educated on the use of SOZO screenings and surveillance principles for early identification of lymphedema onset. She was instructed to use the post op pillow in the axilla for pressure and pain relief. Patient educated on lymphedema risk reduction and post op shoulder/posture HEP. Person educated: Patient Education method: Explanation, Demonstration, Handout Education comprehension: Patient verbalized understanding and returned demonstration  HOME EXERCISE PROGRAM: Patient was instructed today in a home exercise program today for post op shoulder range of motion. These included active assist shoulder flexion in sitting, scapular retraction, wall walking with shoulder abduction, and hands behind head external rotation.  She was encouraged to do these twice a day, holding 3 seconds and repeating 5 times when permitted by her physician.   ASSESSMENT:  CLINICAL IMPRESSION: Pt will be having a Lt mastectomy and will benefit from a post op PT reassessment to determine needs and  from L-Dex screens every 3 months for 2 years to detect subclinical lymphedema.  Pt will benefit from skilled therapeutic intervention to improve on the following deficits: Decreased knowledge of precautions, impaired UE functional use, pain, decreased ROM, postural dysfunction.   PT treatment/interventions: ADL/self-care home management, pt/family education, therapeutic exercise  REHAB POTENTIAL: Excellent  CLINICAL DECISION MAKING: Stable/uncomplicated  EVALUATION COMPLEXITY: Low   GOALS: Goals reviewed with patient? YES  LONG TERM GOALS: (STG=LTG)    Name Target Date Goal status  1 Pt will be able to verbalize understanding of pertinent lymphedema risk reduction practices relevant to her dx specifically related to skin care.  Baseline:  No knowledge 05/28/2024 Achieved at eval  2 Pt will be able to return demo and/or verbalize understanding of the post op HEP related to regaining shoulder ROM. Baseline:  No knowledge 05/28/2024 Achieved at eval  3 Pt will be able to verbalize understanding of the importance of viewing the post op After Breast CA Class video for further lymphedema risk reduction education and therapeutic exercise.  Baseline:  No knowledge 05/28/2024 Achieved at eval  4 Pt will demo she has regained full shoulder ROM and function post operatively compared to baselines.  Baseline: See objective measurements taken today. 07/10/24     PLAN:  PT FREQUENCY/DURATION: EVAL and 1 follow up appointment.   PLAN FOR NEXT SESSION: will reassess 3-4 weeks post op to determine needs.   Patient will follow up at outpatient cancer rehab 3-4 weeks following surgery.  If the patient requires physical therapy at that time, a specific plan will be dictated and sent to the referring physician for approval. The patient was educated today on appropriate basic range of motion exercises to begin post operatively and the importance of viewing the After Breast Cancer class video following  surgery.  Patient was educated today on lymphedema risk reduction practices as it pertains to recommendations that will benefit the patient immediately following surgery.  She verbalized good understanding.    Physical Therapy Information for After Breast Cancer Surgery/Treatment:  Lymphedema is a swelling condition that you may be at risk for in your arm if you have lymph nodes removed from the armpit area.  After a sentinel node biopsy, the risk is approximately 5-9% and is higher after an axillary node dissection.  There is treatment available for this  condition and it is not life-threatening.  Contact your physician or physical therapist with concerns. You may begin the 4 shoulder/posture exercises (see additional sheet) when permitted by your physician (typically a week after surgery).  If you have drains, you may need to wait until those are removed before beginning range of motion exercises.  A general recommendation is to not lift your arms above shoulder height until drains are removed.  These exercises should be done to your tolerance and gently.  This is not a no pain/no gain type of recovery so listen to your body and stretch into the range of motion that you can tolerate, stopping if you have pain.  If you are having immediate reconstruction, ask your plastic surgeon about doing exercises as he or she may want you to wait. We encourage you to view the After Breast Cancer class video following surgery.  You will learn information related to lymphedema risk, prevention and treatment and additional exercises to regain mobility following surgery.   While undergoing any medical procedure or treatment, try to avoid blood pressure being taken or needle sticks from occurring on the arm on the side of cancer.   This recommendation begins after surgery and continues for the rest of your life.  This may help reduce your risk of getting lymphedema (swelling in your arm). An excellent resource for those  seeking information on lymphedema is the National Lymphedema Network's web site. It can be accessed at www.lymphnet.org If you notice swelling in your hand, arm or breast at any time following surgery (even if it is many years from now), please contact your doctor or physical therapist to discuss this.  Lymphedema can be treated at any time but it is easier for you if it is treated early on.  If you feel like your shoulder motion is not returning to normal in a reasonable amount of time, please contact your surgeon or physical therapist.  [facility] [phone]. [address], [place] [address]  [facility] CLASS After Breast Cancer Class  After Breast Cancer Class is a specially designed exercise class video to assist you in a safe recover after having breast cancer surgery.  In this video you will learn how to get back to full function whether your drains were just removed or if you had surgery a month ago. The video can be viewed on this page: [email or web address].boyd-meyer.org/ or on YouTube here: https://[email or web address].[email or web address].  Class Goals  Understand specific stretches to improve the flexibility of you chest and shoulder. Learn ways to safely strengthen your upper body and improve your posture. Understand the warning signs of infection and why you may be at risk for an arm infection. Learn about Lymphedema and prevention.  ** You do not need to view this video until after surgery.  Drains should be removed to participate in the recommended exercises on the video.  Patient was instructed today in a home exercise program today for post op shoulder range of motion. These included active assist shoulder flexion in sitting, scapular retraction, wall walking with shoulder abduction, and hands behind head external rotation.  She was encouraged to do these twice a day, holding 3 seconds and repeating 5 times  when permitted by her physician.    Larue Saddie SAUNDERS, PT 05/28/2024, 8:46 AM   "

## 2024-05-28 ENCOUNTER — Encounter (HOSPITAL_BASED_OUTPATIENT_CLINIC_OR_DEPARTMENT_OTHER)
Admission: RE | Admit: 2024-05-28 | Discharge: 2024-05-28 | Disposition: A | Discharge status: Home / Self Care | Source: Ambulatory Visit | Attending: Surgery | Admitting: Surgery

## 2024-05-28 ENCOUNTER — Ambulatory Visit: Attending: Surgery | Admitting: Rehabilitation

## 2024-05-28 ENCOUNTER — Encounter: Payer: Self-pay | Admitting: Rehabilitation

## 2024-05-28 DIAGNOSIS — Z9189 Other specified personal risk factors, not elsewhere classified: Secondary | ICD-10-CM | POA: Diagnosis present

## 2024-05-28 DIAGNOSIS — C50912 Malignant neoplasm of unspecified site of left female breast: Secondary | ICD-10-CM | POA: Diagnosis present

## 2024-05-28 DIAGNOSIS — Z853 Personal history of malignant neoplasm of breast: Secondary | ICD-10-CM | POA: Insufficient documentation

## 2024-05-28 DIAGNOSIS — R293 Abnormal posture: Secondary | ICD-10-CM | POA: Insufficient documentation

## 2024-05-28 DIAGNOSIS — Z01818 Encounter for other preprocedural examination: Secondary | ICD-10-CM | POA: Insufficient documentation

## 2024-05-28 MED ORDER — CHLORHEXIDINE GLUCONATE CLOTH 2 % EX PADS: 6.0000 | MEDICATED_PAD | Freq: Once | CUTANEOUS | Status: DC

## 2024-05-28 NOTE — Progress Notes (Signed)

## 2024-06-03 ENCOUNTER — Ambulatory Visit (HOSPITAL_BASED_OUTPATIENT_CLINIC_OR_DEPARTMENT_OTHER)
Admission: RE | Admit: 2024-06-03 | Discharge: 2024-06-03 | Disposition: A | Source: Home / Self Care | Attending: Surgery | Admitting: Surgery

## 2024-06-03 ENCOUNTER — Encounter (HOSPITAL_BASED_OUTPATIENT_CLINIC_OR_DEPARTMENT_OTHER): Payer: Self-pay | Admitting: Surgery

## 2024-06-03 ENCOUNTER — Ambulatory Visit (HOSPITAL_BASED_OUTPATIENT_CLINIC_OR_DEPARTMENT_OTHER): Admitting: Certified Registered"

## 2024-06-03 ENCOUNTER — Other Ambulatory Visit: Payer: Self-pay

## 2024-06-03 ENCOUNTER — Encounter (HOSPITAL_BASED_OUTPATIENT_CLINIC_OR_DEPARTMENT_OTHER)
Admission: RE | Disposition: A | Discharge status: Home / Self Care | Payer: Self-pay | Source: Home / Self Care | Attending: Surgery

## 2024-06-03 DIAGNOSIS — I10 Essential (primary) hypertension: Secondary | ICD-10-CM

## 2024-06-03 HISTORY — DX: Essential (primary) hypertension: I10

## 2024-06-03 MED ORDER — LIDOCAINE 2% (20 MG/ML) 5 ML SYRINGE
INTRAMUSCULAR | Status: AC
  Filled 2024-06-03: qty 5

## 2024-06-03 MED ORDER — LACTATED RINGERS IV SOLN: INTRAVENOUS | Status: DC

## 2024-06-03 MED ORDER — MIDAZOLAM HCL 2 MG/2ML IJ SOLN
INTRAMUSCULAR | Status: AC
  Filled 2024-06-03: qty 2

## 2024-06-03 MED ORDER — ACETAMINOPHEN 500 MG PO TABS
1000.0000 mg | ORAL_TABLET | ORAL | Status: AC
  Administered 2024-06-03: 1000 mg via ORAL

## 2024-06-03 MED ORDER — HYDROMORPHONE HCL 1 MG/ML IJ SOLN
0.2500 mg | INTRAMUSCULAR | Status: DC | PRN
  Administered 2024-06-03 (×3): 0.5 mg via INTRAVENOUS

## 2024-06-03 MED ORDER — HYDROMORPHONE HCL 1 MG/ML IJ SOLN
INTRAMUSCULAR | Status: AC
  Filled 2024-06-03: qty 0.5

## 2024-06-03 MED ORDER — IBUPROFEN 800 MG PO TABS: 800.0000 mg | ORAL_TABLET | Freq: Three times a day (TID) | ORAL | 0 refills | Status: AC | PRN

## 2024-06-03 MED ORDER — MAGTRACE LYMPHATIC TRACER
INTRAMUSCULAR | Status: DC | PRN
  Administered 2024-06-03: 2 mL via INTRAMUSCULAR

## 2024-06-03 MED ORDER — AMISULPRIDE (ANTIEMETIC) 5 MG/2ML IV SOLN: 10.0000 mg | Freq: Once | INTRAVENOUS | Status: DC | PRN

## 2024-06-03 MED ORDER — TRANEXAMIC ACID 1000 MG/10ML IV SOLN
Status: DC | PRN
  Administered 2024-06-03: 3000 mg via TOPICAL

## 2024-06-03 MED ORDER — FENTANYL CITRATE (PF) 100 MCG/2ML IJ SOLN
100.0000 ug | Freq: Once | INTRAMUSCULAR | Status: AC
  Administered 2024-06-03: 100 ug via INTRAVENOUS

## 2024-06-03 MED ORDER — OXYCODONE HCL 5 MG PO TABS: 5.0000 mg | ORAL_TABLET | Freq: Four times a day (QID) | ORAL | 0 refills | Status: AC | PRN

## 2024-06-03 MED ORDER — ACETAMINOPHEN 500 MG PO TABS
ORAL_TABLET | ORAL | Status: AC
  Filled 2024-06-03: qty 2

## 2024-06-03 MED ORDER — LIDOCAINE 2% (20 MG/ML) 5 ML SYRINGE
INTRAMUSCULAR | Status: DC | PRN
  Administered 2024-06-03: 60 mg via INTRAVENOUS

## 2024-06-03 MED ORDER — METHOCARBAMOL 1000 MG/10ML IJ SOLN: 500.0000 mg | Freq: Three times a day (TID) | INTRAMUSCULAR | Status: DC | PRN

## 2024-06-03 MED ORDER — PROPOFOL 10 MG/ML IV BOLUS
INTRAVENOUS | Status: DC | PRN
  Administered 2024-06-03: 200 mg via INTRAVENOUS

## 2024-06-03 MED ORDER — SODIUM CHLORIDE 0.9 % IV SOLN: 250.0000 mL | INTRAVENOUS | Status: DC | PRN

## 2024-06-03 MED ORDER — SODIUM CHLORIDE 0.9% FLUSH: 3.0000 mL | INTRAVENOUS | Status: DC | PRN

## 2024-06-03 MED ORDER — EPHEDRINE SULFATE (PRESSORS) 25 MG/5ML IV SOSY
PREFILLED_SYRINGE | INTRAVENOUS | Status: DC | PRN
  Administered 2024-06-03: 5 mg via INTRAVENOUS
  Administered 2024-06-03 (×2): 10 mg via INTRAVENOUS

## 2024-06-03 MED ORDER — SODIUM CHLORIDE 0.9% FLUSH: 3.0000 mL | Freq: Two times a day (BID) | INTRAVENOUS | Status: DC

## 2024-06-03 MED ORDER — TRANEXAMIC ACID 1000 MG/10ML IV SOLN
INTRAVENOUS | Status: AC
  Filled 2024-06-03: qty 30

## 2024-06-03 MED ORDER — METHYLENE BLUE 20 MG/2ML IV SOSY
PREFILLED_SYRINGE | INTRAVENOUS | Status: AC
  Filled 2024-06-03: qty 2

## 2024-06-03 MED ORDER — FENTANYL CITRATE (PF) 100 MCG/2ML IJ SOLN
INTRAMUSCULAR | Status: DC | PRN
  Administered 2024-06-03: 100 ug via INTRAVENOUS
  Administered 2024-06-03 (×4): 25 ug via INTRAVENOUS

## 2024-06-03 MED ORDER — OXYCODONE HCL 5 MG/5ML PO SOLN: 5.0000 mg | Freq: Once | ORAL | Status: DC | PRN

## 2024-06-03 MED ORDER — ACETAMINOPHEN 325 MG PO TABS: 650.0000 mg | ORAL_TABLET | ORAL | Status: DC | PRN

## 2024-06-03 MED ORDER — CLINDAMYCIN PHOSPHATE 900 MG/50ML IV SOLN
INTRAVENOUS | Status: AC
  Filled 2024-06-03: qty 50

## 2024-06-03 MED ORDER — ONDANSETRON HCL 4 MG/2ML IJ SOLN
INTRAMUSCULAR | Status: AC
  Filled 2024-06-03: qty 2

## 2024-06-03 MED ORDER — ONDANSETRON HCL 4 MG/2ML IJ SOLN
INTRAMUSCULAR | Status: DC | PRN
  Administered 2024-06-03: 4 mg via INTRAVENOUS

## 2024-06-03 MED ORDER — FENTANYL CITRATE (PF) 100 MCG/2ML IJ SOLN
INTRAMUSCULAR | Status: AC
  Filled 2024-06-03: qty 2

## 2024-06-03 MED ORDER — BUPIVACAINE-EPINEPHRINE (PF) 0.25% -1:200000 IJ SOLN
INTRAMUSCULAR | Status: AC
  Filled 2024-06-03: qty 30

## 2024-06-03 MED ORDER — CLINDAMYCIN PHOSPHATE 900 MG/50ML IV SOLN
900.0000 mg | INTRAVENOUS | Status: AC
  Administered 2024-06-03: 900 mg via INTRAVENOUS

## 2024-06-03 MED ORDER — MEPERIDINE HCL 25 MG/ML IJ SOLN: 6.2500 mg | INTRAMUSCULAR | Status: DC | PRN

## 2024-06-03 MED ORDER — DEXAMETHASONE SOD PHOSPHATE PF 10 MG/ML IJ SOLN
INTRAMUSCULAR | Status: DC | PRN
  Administered 2024-06-03: 5 mg via INTRAVENOUS

## 2024-06-03 MED ORDER — ACETAMINOPHEN 325 MG RE SUPP: 650.0000 mg | RECTAL | Status: DC | PRN

## 2024-06-03 MED ORDER — MIDAZOLAM HCL (PF) 2 MG/2ML IJ SOLN
2.0000 mg | Freq: Once | INTRAMUSCULAR | Status: AC
  Administered 2024-06-03: 2 mg via INTRAVENOUS

## 2024-06-03 MED ORDER — LISINOPRIL 40 MG PO TABS: 40.0000 mg | ORAL_TABLET | Freq: Every day | ORAL | Status: DC

## 2024-06-03 MED ORDER — MORPHINE SULFATE (PF) 4 MG/ML IV SOLN: 1.0000 mg | INTRAVENOUS | Status: DC | PRN

## 2024-06-03 MED ORDER — OXYCODONE HCL 5 MG PO TABS
5.0000 mg | ORAL_TABLET | ORAL | Status: DC | PRN
  Administered 2024-06-03: 10 mg via ORAL
  Filled 2024-06-03 (×2): qty 1

## 2024-06-03 MED ORDER — 0.9 % SODIUM CHLORIDE (POUR BTL) OPTIME
TOPICAL | Status: DC | PRN
  Administered 2024-06-03: 1000 mL

## 2024-06-03 MED ORDER — OXYCODONE HCL 5 MG PO TABS: 5.0000 mg | ORAL_TABLET | Freq: Once | ORAL | Status: DC | PRN

## 2024-06-03 MED ORDER — DEXAMETHASONE SOD PHOSPHATE PF 10 MG/ML IJ SOLN
INTRAMUSCULAR | Status: AC
  Filled 2024-06-03: qty 1

## 2024-06-03 MED ORDER — ONDANSETRON HCL 4 MG/2ML IJ SOLN: 4.0000 mg | INTRAMUSCULAR | Status: DC | PRN

## 2024-06-03 MED ORDER — DEXMEDETOMIDINE HCL IN NACL 80 MCG/20ML IV SOLN
INTRAVENOUS | Status: DC | PRN
  Administered 2024-06-03: 8 ug via INTRAVENOUS
  Administered 2024-06-03: 12 ug via INTRAVENOUS

## 2024-06-03 MED ORDER — PROPOFOL 10 MG/ML IV BOLUS
INTRAVENOUS | Status: AC
  Filled 2024-06-03: qty 20

## 2024-06-03 MED ORDER — ROPIVACAINE HCL 5 MG/ML IJ SOLN
INTRAMUSCULAR | Status: DC | PRN
  Administered 2024-06-03: 30 mL via PERINEURAL

## 2024-06-03 NOTE — Progress Notes (Signed)
 Assisted Dr. Hyacinth Meeker with left, pectoralis, ultrasound guided block. Side rails up, monitors on throughout procedure. See vital signs in flow sheet. Tolerated Procedure well.

## 2024-06-03 NOTE — Transfer of Care (Signed)
 Immediate Anesthesia Transfer of Care Note  Patient: [name]  Procedure(s) Performed: MASTECTOMY WITH SENTINEL LYMPH NODE BIOPSY (Left: Breast)  Patient Location: PACU  Anesthesia Type:General and Regional  Level of Consciousness: awake, alert , and patient cooperative  Airway & Oxygen Therapy: Patient Spontanous Breathing and Patient connected to face mask oxygen  Post-op Assessment: Report given to RN and Post -op Vital signs reviewed and stable  Post vital signs: Reviewed and stable  Last Vitals:  Vitals Value Taken Time  BP 129/81 06/03/24 11:49  Temp    Pulse 83 06/03/24 11:49  Resp 15 06/03/24 11:49  SpO2 96 % 06/03/24 11:49  Vitals shown include unfiled device data.  Last Pain:  Vitals:   06/03/24 0842  TempSrc: Temporal  PainSc:       Patients Stated Pain Goal: 3 (06/03/24 9161)  Complications: No notable events documented.

## 2024-06-03 NOTE — Discharge Instructions (Addendum)
 [facility] [place] [phone]  MASTECTOMY: POST OP INSTRUCTIONS  Always review your discharge instruction sheet given to you by the facility where your surgery was performed. IF YOU HAVE DISABILITY OR FAMILY LEAVE FORMS, YOU MUST BRING THEM TO THE OFFICE FOR PROCESSING.   DO NOT GIVE THEM TO YOUR DOCTOR. A prescription for pain medication may be given to you upon discharge.  Take your pain medication as prescribed, if needed.  If narcotic pain medicine is not needed, then you may take acetaminophen  (Tylenol ) or ibuprofen  (Advil ) as needed. Take your usually prescribed medications unless otherwise directed. If you need a refill on your pain medication, please contact your pharmacy.  They will contact our office to request authorization.  Prescriptions will not be filled after 5pm or on week-ends. You should follow a light diet the first few days after arrival home, such as soup and crackers, etc.  Resume your normal diet the day after surgery. Most patients will experience some swelling and bruising on the chest and underarm.  Ice packs will help.  Swelling and bruising can take several days to resolve.  It is common to experience some constipation if taking pain medication after surgery.  Increasing fluid intake and taking a stool softener (such as Colace) will usually help or prevent this problem from occurring.  A mild laxative (Milk of Magnesia or Miralax) should be taken according to package instructions if there are no bowel movements after 48 hours. Unless discharge instructions indicate otherwise, leave your bandage dry and in place until your next appointment in 3-5 days.  You may take a limited sponge bath.  No tube baths or showers until the drains are removed.  You may have steri-strips (small skin tapes) in place directly over the incision.  These strips should be left on the skin for 7-10 days.  If your surgeon used skin glue on the incision, you may shower in 24 hours.   The glue will flake off over the next 2-3 weeks.  Any sutures or staples will be removed at the office during your follow-up visit. DRAINS:  If you have drains in place, it is important to keep a list of the amount of drainage produced each day in your drains.  Before leaving the hospital, you should be instructed on drain care.  Call our office if you have any questions about your drains. ACTIVITIES:  You may resume regular (light) daily activities beginning the next day--such as daily self-care, walking, climbing stairs--gradually increasing activities as tolerated.  You may have sexual intercourse when it is comfortable.  Refrain from any heavy lifting or straining until approved by your doctor. You may drive when you are no longer taking prescription pain medication, you can comfortably wear a seatbelt, and you can safely maneuver your car and apply brakes. RETURN TO WORK:  __________________________________________________________ [name] should see your doctor in the office for a follow-up appointment approximately 3-5 days after your surgery.  Your doctors nurse will typically make your follow-up appointment when she calls you with your pathology report.  Expect your pathology report 2-3 business days after your surgery.  You may call to check if you do not hear from us  after three days.   OTHER INSTRUCTIONS: ______________________________________________________________________________________________ ____________________________________________________________________________________________ WHEN TO CALL YOUR DOCTOR: Fever over 101.0 Nausea and/or vomiting Extreme swelling or bruising Continued bleeding from incision. Increased pain, redness, or drainage from the incision. The clinic staff is available to answer your questions during regular business hours.  Please dont hesitate  to call and ask to speak to one of the nurses for clinical concerns.  If you have a medical emergency, go to the  nearest emergency room or call 911.  A surgeon from [facility] is always on call at the hospital. [address], [address], [place], [place]  [address] ? [address] [address], [place], [place]   [address] [phone] ? [phone] ? FAX [phone] Web site: [email or web address]    Post Anesthesia Home Care Instructions  Activity: Get plenty of rest for the remainder of the day. A responsible individual must stay with you for 24 hours following the procedure.  For the next 24 hours, DO NOT: -Drive a car -Advertising copywriter -Drink alcoholic beverages -Take any medication unless instructed by your physician -Make any legal decisions or sign important papers.  Meals: Start with liquid foods such as gelatin or soup. Progress to regular foods as tolerated. Avoid greasy, spicy, heavy foods. If nausea and/or vomiting occur, drink only clear liquids until the nausea and/or vomiting subsides. Call your physician if vomiting continues.  Special Instructions/Symptoms: Your throat may feel dry or sore from the anesthesia or the breathing tube placed in your throat during surgery. If this causes discomfort, gargle with warm salt water. The discomfort should disappear within 24 hours.  If you had a scopolamine patch placed behind your ear for the management of post- operative nausea and/or vomiting:  1. The medication in the patch is effective for 72 hours, after which it should be removed.  Wrap patch in a tissue and discard in the trash. Wash hands thoroughly with soap and water. 2. You may remove the patch earlier than 72 hours if you experience unpleasant side effects which may include dry mouth, dizziness or visual disturbances. 3. Avoid touching the patch. Wash your hands with soap and water after contact with the patch.    Information for Discharge Teaching: EXPAREL  (bupivacaine  liposome injectable suspension)   Pain relief is important to your recovery. The goal is to control your pain  so you can move easier and return to your normal activities as soon as possible after your procedure. Your physician may use several types of medicines to manage pain, swelling, and more.  Your surgeon or anesthesiologist gave you EXPAREL (bupivacaine ) to help control your pain after surgery.  EXPAREL  is a local anesthetic designed to release slowly over an extended period of time to provide pain relief by numbing the tissue around the surgical site. EXPAREL  is designed to release pain medication over time and can control pain for up to 72 hours. Depending on how you respond to EXPAREL , you may require less pain medication during your recovery. EXPAREL  can help reduce or eliminate the need for opioids during the first few days after surgery when pain relief is needed the most. EXPAREL  is not an opioid and is not addictive. It does not cause sleepiness or sedation.   Important! A teal colored band has been placed on your arm with the date, time and amount of EXPAREL  you have received. Please leave this armband in place for the full 96 hours following administration, and then you may remove the band. If you return to the hospital for any reason within 96 hours following the administration of EXPAREL , the armband provides important information that your health care providers to know, and alerts them that you have received this anesthetic.    Possible side effects of EXPAREL : Temporary loss of sensation or ability to move in the area where  medication was injected. Nausea, vomiting, constipation Rarely, numbness and tingling in your mouth or lips, lightheadedness, or anxiety may occur. Call your doctor right away if you think you may be experiencing any of these sensations, or if you have other questions regarding possible side effects.  Follow all other discharge instructions given to you by your surgeon or nurse. Eat a healthy diet and drink plenty of water or other fluids.

## 2024-06-03 NOTE — Anesthesia Postprocedure Evaluation (Signed)
"   Anesthesia Post Note  Patient: [name]  Procedure(s) Performed: MASTECTOMY WITH SENTINEL LYMPH NODE BIOPSY (Left: Breast)     Patient location during evaluation: PACU Anesthesia Type: General Level of consciousness: awake and alert Pain management: pain level controlled Vital Signs Assessment: post-procedure vital signs reviewed and stable Respiratory status: spontaneous breathing, nonlabored ventilation and respiratory function stable Cardiovascular status: blood pressure returned to baseline and stable Postop Assessment: no apparent nausea or vomiting Anesthetic complications: no   No notable events documented.  Last Vitals:  Vitals:   06/03/24 1230 06/03/24 1300  BP: 124/70 (!) 143/91  Pulse: 85 83  Resp: 18 15  Temp:  (!) 36.2 C  SpO2: 99% 96%    Last Pain:  Vitals:   06/03/24 1316  TempSrc:   PainSc: 5                  Butler Levander Pinal      "

## 2024-06-03 NOTE — H&P (Signed)
 History of Present Illness: [name] is a 64 y.o. 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 This was found to be a 2 cm nodule measured by ultrasound core biopsy proven to be a papillary carcinoma. History of left breast cancer treated in 2003 with breast conserving surgery, radiation therapy and chemotherapy. She had a triple positive tumor look like. History of appendiceal carcinoma status post HIPEC in 2014 at [facility].  Patient denies breast pain, breast nipple discharge or change in the appearance of the breast.  Review of Systems: A complete review of systems was obtained from the patient. I have reviewed this information and discussed as appropriate with the patient. See HPI as well for other ROS.    Medical History: Past Medical History:  Diagnosis Date  DVT (deep venous thrombosis) (CMS/HHS-HCC)  History of cancer  Hypertension   There is no problem list on file for this patient.  Past Surgical History:  Procedure Laterality Date  .hipec  APPENDECTOMY  COLON SURGERY  HYSTERECTOMY  MASTECTOMY PARTIAL / LUMPECTOMY    Allergies  Allergen Reactions  Penicillins Hives, Other (See Comments), Rash and Swelling  Product containing penicillin (product)   Current Outpatient Medications on File Prior to Visit  Medication Sig Dispense Refill  aspirin -acetaminophen -caffeine (EXCEDRIN EXTRA STRENGTH) 250-250-65 mg per tablet Take 1 tablet by mouth every 6 (six) hours as needed  fluticasone propionate (FLONASE ALLERGY  RELIEF) 50 mcg/actuation nasal spray 1 spray in each nostril Nasally Twice a day as needed  levothyroxine (SYNTHROID) 75 MCG tablet Take 75 mcg by mouth once daily  lisinopril  (ZESTRIL ) 40 MG tablet 1 tablet Orally Once a day 90 days  oxymetazoline (AFRIN) 0.05 % nasal mist 4 sprays (2 sprays in each nostril)  Nasally daily at night as needed   No current facility-administered medications on file prior to visit.   Family History  Problem Relation Age of Onset  Obesity Mother  High blood pressure (Hypertension) Mother  Hyperlipidemia (Elevated cholesterol) Mother  Deep vein thrombosis (DVT or abnormal blood clot formation) Mother  Breast cancer Mother  Obesity Father  High blood pressure (Hypertension) Father  Obesity Brother  High blood pressure (Hypertension) Brother  Hyperlipidemia (Elevated cholesterol) Brother    Social History   Tobacco Use  Smoking Status Never  Smokeless Tobacco Never    Social History   Socioeconomic History  Marital status: Married  Tobacco Use  Smoking status: Never  Smokeless tobacco: Never  Vaping Use  Vaping status: Unknown  Substance and Sexual Activity  Alcohol use: Never  Drug use: Never   Social Drivers of Health   Housing Stability: Unknown (05/22/2024)  Housing Stability Vital Sign  Homeless in the Last Year: No   Objective:   Vitals:  05/22/24 1006  BP: 130/81  Pulse: 88  Resp: 14  Temp: 37.1 C (98.7 F)  SpO2: 98%  Weight: 97.6 kg (215 lb 3.2 oz)  Height: 170.2 cm (5' 7)  PainSc: 0-No pain   Body mass index is 33.71 kg/m.  Physical Exam Exam conducted with a chaperone present.  Cardiovascular:  Rate and Rhythm: Normal rate.  Chest:   Comments: Left breast shows postradiation changes. Swelling noted around 12:00 upper left breast. Scar noted from previous surgery. Right breast normal. Scar is upper right chest from Port-A-Cath placement. Abdominal:  Palpations: Abdomen is soft.  Tenderness: There is no  abdominal tenderness.   Musculoskeletal:  Cervical back: Normal range of motion.     Labs, Imaging and Diagnostic Testing:  CLINICAL DATA: LEFT breast callback. History of a LEFT lumpectomy  in 2003.   EXAM:  DIGITAL DIAGNOSTIC UNILATERAL LEFT MAMMOGRAM WITH TOMOSYNTHESIS AND  CAD; ULTRASOUND LEFT BREAST  LIMITED   TECHNIQUE:  Left digital diagnostic mammography and breast tomosynthesis was  performed. The images were evaluated with computer-aided detection.  ; Targeted ultrasound examination of the left breast was performed.   COMPARISON: Previous exam(s).   ACR Breast Density Category c: The breasts are heterogeneously  dense, which may obscure small masses.   FINDINGS:  Spot compression tomosynthesis views demonstrates a persistent  irregular mass with subtle associated architectural distortion in  the LEFT upper inner breast at middle to posterior depth.   Targeted ultrasound was performed of the LEFT breast. At 11 o'clock  3 cm nipple, there is an oval hypoechoic mass with irregular  margins. It measures 21 x 11 x 11 mm. This corresponds to the site  of mammographic concern.   Targeted ultrasound was performed of the LEFT axilla. No suspicious  axillary lymph nodes are visualized.   IMPRESSION:  1. There is a suspicious 21 mm mass at the site of screening  mammographic concern. Recommend ultrasound-guided biopsy for  definitive characterization.  2. No suspicious LEFT axillary adenopathy.   RECOMMENDATION:  LEFT breast ultrasound-guided biopsy x1   I have discussed the findings and recommendations with the patient.  The biopsy procedure was discussed with the patient and questions  were answered. Patient expressed their understanding of the biopsy  recommendation. Patient will be scheduled for biopsy today. Ordering  provider will be notified. If applicable, a reminder letter will be  sent to the patient regarding the next appointment.   BI-RADS CATEGORY 5: Highly suggestive of malignancy.   Electronically Signed  By: Corean Salter M.D   FINAL DIAGNOSIS   1. Breast, left, needle core biopsy, 11 o'clock, 3cmfn, venus clip :  - PAPILLARY CARCINOMA, SEE NOTE  NOTE: THE LESION CONSISTS OF A SOLID RELATIVELY WELL-CIRCUMSCRIBED NODULES (9 MM  IN GREASTEST LINEAR  DIMENSTION) OF MONOMORPHIC EPITHELIAL CELLS WITH  INTERMEDIATE GRADE NUCLEAR ATYPIA IN THE BACKGROUND OF ABUNDANT FIBROVASCULAR  CORES. PAPILLARY LESIONS HAVE A DIFFERENTIAL THAT INCLUDES ENTITIES SUCH AS  SOLID PAPILLARY CARCINOMA AS WELL AS ENCAPSULATED PAPILLARY CARCINOMA BOTH OF  WHICH CAN BE CONSIDERED VARIANTS OF DUCTAL CARCINOMA IN SITU EVEN IN THE ABSENCE  OF MYOEPITHELIAL CELLS. WHILE THERE FOCAL AREAS SUSPICIOUS FOR POSSIBLE  INVASION ON THIS OTHERWISE WELL CIRCUMSCRIBED TUMOR DEFINITIVE CLASSIFICATION IS  BEST MADE ON THE EXCISION.   DATE SIGNED OUT: 05/15/2024  ELECTRONIC SIGNATURE : Legolvan Do, Mark, Pathologist, Electronic Signature   MICROSCOPIC DESCRIPTION   CASE COMMENTS  STAINS USED IN DIAGNOSIS:  H&E-2  H&E-3  H&E-4  H&E  *RECUT 1 SLIDE  Stains used in diagnosis 1 ER-ACIS, 1 PR-ACIS  Estrogen receptor (6F11), immunohistochemical stains are performed on formalin  fixed, paraffin embedded tissue using a 3,3-diaminobenzidine (DAB) chromogen  and Leica Bond Autostainer System. The staining intensity of the nucleus is  scored manually and is reported as the percentage of tumor cell nuclei  demonstrating specific nuclear staining.Specimens are fixed in 10% Neutral  Buffered Formalin for at least 6 hours and up to 72 hours. These tests have not  be validated on decalcified tissue. Results should be interpreted with caution  given the possibility of false negative results on decalcified specimens.  PR progesterone receptor (16), immunohistochemical stains are performed on  formalin fixed, paraffin embedded tissue using a 3,3-diaminobenzidine (DAB)  chromogen and Leica Bond Autostainer System. The staining intensity of the  nucleus is scored manually and is reported as the percentage of tumor cell  nuclei demonstrating specific nuclear staining.Specimens are fixed in 10%  Neutral Buffered Formalin for at least 6 hours and up to 72 hours. These tests  have not be  validated on decalcified tissue. Results should be interpreted with  caution given the possibility of false negative results on decalcified  specimens.   ADDENDUM  Breast, left, needle core biopsy  PROGNOSTIC INDICATORS  Results:  IMMUNOHISTOCHEMICAL AND MORPHOMETRIC ANALYSIS PERFORMED MANUALLY  Estrogen Receptor: 95%, positive, strong staining intensity  Progesterone Receptor: 95%, positive, strong staining intensity  COMMENT: The negative hormone receptor study(ies) in this case has an internal positive control.   REFERENCE RANGE ESTROGEN RECEPTOR  NEGATIVE 0%  POSITIVE =>1%  REFERENCE RANGE PROGESTERONE RECEPTOR  NEGATIVE 0%  POSITIVE =>1%  All controls stained appropriately  Pepper Dutton Md, Pathologist, Electronic Signature  [phone])   CLINICAL HISTORY   SPECIMEN(S) OBTAINED  1. Breast, left, needle core biopsy, 11 O'clock, 3cmfn, Venus Clip   SPECIMEN COMMENTS:  1. TIF: 10:59 AM, CIT < 1 minute; 2.1cm mass  SPECIMEN CLINICAL INFORMATION:  1. Benign vs carcinoma   Gross Description  1. Received in formalin labeled left breast 11 o'clock, 3 cm from nipple are  three tan-red fibroadipose tissue cores ranging is size from 0.8 x 0.5 x 0.3 cm  to 1.2 x 0.3 x 0.2 cm. The specimen is entirely submitted in 1 cassette.  TIF: 10:59 a.m. on 05/14/24  CIT: Less than 1 minute  VG 05/14/2024   Assessment and Plan:   Diagnoses and all orders for this visit:  Breast cancer, stage 2, left (CMS/HHS-HCC) - Ambulatory Referral to Oncology-Medical - Ambulatory Referral to Physical Therapy   Previous history of stage I left breast cancer ER positive, PR positive, HER2/neu +2003 status post left breast conserving surgery followed by radiation therapy and chemotherapy. 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  Discussed best option would be a left simple mastectomy with attempted sentinel if no mapping. Which she  agrees to proceed. She will see medical oncology as well as physical therapy  Reviewed risk of bleeding, infection, flap necrosis, wound complications, poor wound healing secondary to radiation exposure, lymphedema, injury to nerves, blood vessels, veins, need for revisional surgery. She has no interest reconstruction.   [address] [place], [place]

## 2024-06-03 NOTE — Anesthesia Preprocedure Evaluation (Signed)
"                                    Anesthesia Evaluation  Patient identified by MRN, date of birth, ID band Patient awake    Reviewed: Allergy  & Precautions, H&P , NPO status , Patient's Chart, lab work & pertinent test results  Airway Mallampati: II  TM Distance: >3 FB Neck ROM: Full    Dental  (+) Dental Advisory Given   Pulmonary neg pulmonary ROS   Pulmonary exam normal breath sounds clear to auscultation       Cardiovascular hypertension, Pt. on medications + Past MI  Normal cardiovascular exam Rhythm:Regular Rate:Normal     Neuro/Psych  Headaches  negative psych ROS   GI/Hepatic negative GI ROS, Neg liver ROS,,,  Endo/Other  Hypothyroidism    Renal/GU negative Renal ROS  negative genitourinary   Musculoskeletal negative musculoskeletal ROS (+)    Abdominal  (+) + obese  Peds negative pediatric ROS (+)  Hematology negative hematology ROS (+)   Anesthesia Other Findings   Reproductive/Obstetrics negative OB ROS                              Anesthesia Physical Anesthesia Plan  ASA: 3  Anesthesia Plan: General   Post-op Pain Management: Tylenol  PO (pre-op)* and Regional block*   Induction: Intravenous  PONV Risk Score and Plan: 3 and Ondansetron , Dexamethasone , Midazolam  and Treatment may vary due to age or medical condition  Airway Management Planned: LMA  Additional Equipment:   Intra-op Plan:   Post-operative Plan: Extubation in OR  Informed Consent: I have reviewed the patients History and Physical, chart, labs and discussed the procedure including the risks, benefits and alternatives for the proposed anesthesia with the patient or authorized representative who has indicated his/her understanding and acceptance.     Dental advisory given  Plan Discussed with: CRNA  Anesthesia Plan Comments:         Anesthesia Quick Evaluation  "

## 2024-06-03 NOTE — Anesthesia Procedure Notes (Signed)
 Procedure Name: LMA Insertion Date/Time: 06/03/2024 10:19 AM  Performed by: Burnard Rosaline HERO, CRNAPre-anesthesia Checklist: Patient identified, Emergency Drugs available, Suction available and Patient being monitored Patient Re-evaluated:Patient Re-evaluated prior to induction Oxygen Delivery Method: Circle system utilized Preoxygenation: Pre-oxygenation with 100% oxygen Induction Type: IV induction Ventilation: Mask ventilation without difficulty LMA: LMA inserted LMA Size: 4.0 Number of attempts: 1 Airway Equipment and Method: Bite block Placement Confirmation: positive ETCO2, breath sounds checked- equal and bilateral and CO2 detector Tube secured with: Tape Dental Injury: Teeth and Oropharynx as per pre-operative assessment

## 2024-06-03 NOTE — Discharge Summary (Signed)
 Physician Discharge Summary  Patient ID: [name] MRN: [id number] DOB/AGE: 05/07/1960 64 y.o.  Admit date: 06/03/2024 Discharge date: 06/03/2024  Admission Diagnoses:left breast cancer  Discharge Diagnoses: same  Active Problems:   * No active hospital problems. *   Discharged Condition: good  Hospital Course: Pt did well D/C POD 0        Treatments: surgery: left mastectomy   Discharge Exam: Blood pressure (!) 143/91, pulse 83, temperature (!) 97.1 F (36.2 C), resp. rate 15, height 5' 7 (1.702 m), weight 95.3 kg, SpO2 96%. Incision/Wound:CDI no hematoma flaps viable   Disposition: Discharge disposition: 01-Home or Self Care       Discharge Instructions     Increase activity slowly   Complete by: As directed    Increase activity slowly   Complete by: As directed       Allergies as of 06/03/2024       Reactions   Penicillins Swelling, Rash        Medication List     TAKE these medications    CHLOROPHYLL PO Take 30 mLs by mouth at bedtime.   Excedrin Migraine 250-250-65 MG tablet Generic drug: aspirin -acetaminophen -caffeine Take 1 tablet by mouth every 6 (six) hours as needed for headache.   Excedrin Tension Headache 500-65 MG Tabs per tablet Generic drug: acetaminophen -caffeine Take 1 tablet by mouth every 6 (six) hours as needed (headaches).   fluticasone 50 MCG/ACT nasal spray Commonly known as: FLONASE Place into both nostrils daily.   ibuprofen  800 MG tablet Commonly known as: ADVIL  Take 1 tablet (800 mg total) by mouth every 8 (eight) hours as needed.   levothyroxine 50 MCG tablet Commonly known as: SYNTHROID Take 50 mcg by mouth daily before breakfast.   lisinopril  40 MG tablet Commonly known as: ZESTRIL  Take 40 mg by mouth daily. Wednesday and Sunday take 75 mg   oxyCODONE  5 MG immediate release tablet Commonly known as: Oxy IR/ROXICODONE  Take 1 tablet (5 mg total) by mouth every 6 (six) hours as needed for severe pain (pain  score 7-10).   oxyCODONE -acetaminophen  5-325 MG tablet Commonly known as: Percocet Take 1 tablet by mouth every 4 (four) hours as needed for severe pain.         Signed: Debby LABOR Audrinna Sherman 06/03/2024, 2:30 PM

## 2024-06-03 NOTE — Interval H&P Note (Signed)
 History and Physical Interval Note:  06/03/2024 9:14 AM  [name]  has presented today for surgery, with the diagnosis of LEFT BR CANCER.  The various methods of treatment have been discussed with the patient and family. After consideration of risks, benefits and other options for treatment, the patient has consented to  Procedures with comments: MASTECTOMY WITH SENTINEL LYMPH NODE BIOPSY (Left) - GEN w/PEC BLOCK as a surgical intervention.  The patient's history has been reviewed, patient examined, no change in status, stable for surgery.  I have reviewed the patient's chart and labs.  Questions were answered to the patient's satisfaction.   The surgical and non surgical options have been discussed with the patient.  Risks of surgery include bleeding,  Infection,  Flap necrosis,  Tissue loss,  Chronic pain, death, Numbness,  And the need for additional procedures.  Reconstruction options also have been discussed with the patient as well.  The patient agrees to proceed.   Pharoah Goggins A Miriah Maruyama

## 2024-06-03 NOTE — H&P (Signed)
 History of Present Illness: [name] is a 64 y.o. 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 This was found to be a 2 cm nodule measured by ultrasound core biopsy proven to be a papillary carcinoma. History of left breast cancer treated in 2003 with breast conserving surgery, radiation therapy and chemotherapy. She had a triple positive tumor look like. History of appendiceal carcinoma status post HIPEC in 2014 at [facility].  Patient denies breast pain, breast nipple discharge or change in the appearance of the breast.  Review of Systems: A complete review of systems was obtained from the patient. I have reviewed this information and discussed as appropriate with the patient. See HPI as well for other ROS.    Medical History: Past Medical History:  Diagnosis Date  DVT (deep venous thrombosis) (CMS/HHS-HCC)  History of cancer  Hypertension   There is no problem list on file for this patient.  Past Surgical History:  Procedure Laterality Date  .hipec  APPENDECTOMY  COLON SURGERY  HYSTERECTOMY  MASTECTOMY PARTIAL / LUMPECTOMY    Allergies  Allergen Reactions  Penicillins Hives, Other (See Comments), Rash and Swelling  Product containing penicillin (product)   Current Outpatient Medications on File Prior to Visit  Medication Sig Dispense Refill  aspirin -acetaminophen -caffeine (EXCEDRIN EXTRA STRENGTH) 250-250-65 mg per tablet Take 1 tablet by mouth every 6 (six) hours as needed  fluticasone propionate (FLONASE ALLERGY  RELIEF) 50 mcg/actuation nasal spray 1 spray in each nostril Nasally Twice a day as needed  levothyroxine (SYNTHROID) 75 MCG tablet Take 75 mcg by mouth once daily  lisinopril  (ZESTRIL ) 40 MG tablet 1 tablet Orally Once a day 90 days  oxymetazoline (AFRIN) 0.05 % nasal mist 4 sprays (2 sprays in each nostril)  Nasally daily at night as needed   No current facility-administered medications on file prior to visit.   Family History  Problem Relation Age of Onset  Obesity Mother  High blood pressure (Hypertension) Mother  Hyperlipidemia (Elevated cholesterol) Mother  Deep vein thrombosis (DVT or abnormal blood clot formation) Mother  Breast cancer Mother  Obesity Father  High blood pressure (Hypertension) Father  Obesity Brother  High blood pressure (Hypertension) Brother  Hyperlipidemia (Elevated cholesterol) Brother    Social History   Tobacco Use  Smoking Status Never  Smokeless Tobacco Never    Social History   Socioeconomic History  Marital status: Married  Tobacco Use  Smoking status: Never  Smokeless tobacco: Never  Vaping Use  Vaping status: Unknown  Substance and Sexual Activity  Alcohol use: Never  Drug use: Never   Social Drivers of Health   Housing Stability: Unknown (05/22/2024)  Housing Stability Vital Sign  Homeless in the Last Year: No   Objective:   Vitals:  05/22/24 1006  BP: 130/81  Pulse: 88  Resp: 14  Temp: 37.1 C (98.7 F)  SpO2: 98%  Weight: 97.6 kg (215 lb 3.2 oz)  Height: 170.2 cm (5' 7)  PainSc: 0-No pain   Body mass index is 33.71 kg/m.  Physical Exam Exam conducted with a chaperone present.  Cardiovascular:  Rate and Rhythm: Normal rate.  Chest:   Comments: Left breast shows postradiation changes. Swelling noted around 12:00 upper left breast. Scar noted from previous surgery. Right breast normal. Scar is upper right chest from Port-A-Cath placement. Abdominal:  Palpations: Abdomen is soft.  Tenderness: There is no  abdominal tenderness.   Musculoskeletal:  Cervical back: Normal range of motion.     Labs, Imaging and Diagnostic Testing:  CLINICAL DATA: LEFT breast callback. History of a LEFT lumpectomy  in 2003.   EXAM:  DIGITAL DIAGNOSTIC UNILATERAL LEFT MAMMOGRAM WITH TOMOSYNTHESIS AND  CAD; ULTRASOUND LEFT BREAST  LIMITED   TECHNIQUE:  Left digital diagnostic mammography and breast tomosynthesis was  performed. The images were evaluated with computer-aided detection.  ; Targeted ultrasound examination of the left breast was performed.   COMPARISON: Previous exam(s).   ACR Breast Density Category c: The breasts are heterogeneously  dense, which may obscure small masses.   FINDINGS:  Spot compression tomosynthesis views demonstrates a persistent  irregular mass with subtle associated architectural distortion in  the LEFT upper inner breast at middle to posterior depth.   Targeted ultrasound was performed of the LEFT breast. At 11 o'clock  3 cm nipple, there is an oval hypoechoic mass with irregular  margins. It measures 21 x 11 x 11 mm. This corresponds to the site  of mammographic concern.   Targeted ultrasound was performed of the LEFT axilla. No suspicious  axillary lymph nodes are visualized.   IMPRESSION:  1. There is a suspicious 21 mm mass at the site of screening  mammographic concern. Recommend ultrasound-guided biopsy for  definitive characterization.  2. No suspicious LEFT axillary adenopathy.   RECOMMENDATION:  LEFT breast ultrasound-guided biopsy x1   I have discussed the findings and recommendations with the patient.  The biopsy procedure was discussed with the patient and questions  were answered. Patient expressed their understanding of the biopsy  recommendation. Patient will be scheduled for biopsy today. Ordering  provider will be notified. If applicable, a reminder letter will be  sent to the patient regarding the next appointment.   BI-RADS CATEGORY 5: Highly suggestive of malignancy.   Electronically Signed  By: Corean Salter M.D   FINAL DIAGNOSIS   1. Breast, left, needle core biopsy, 11 o'clock, 3cmfn, venus clip :  - PAPILLARY CARCINOMA, SEE NOTE  NOTE: THE LESION CONSISTS OF A SOLID RELATIVELY WELL-CIRCUMSCRIBED NODULES (9 MM  IN GREASTEST LINEAR  DIMENSTION) OF MONOMORPHIC EPITHELIAL CELLS WITH  INTERMEDIATE GRADE NUCLEAR ATYPIA IN THE BACKGROUND OF ABUNDANT FIBROVASCULAR  CORES. PAPILLARY LESIONS HAVE A DIFFERENTIAL THAT INCLUDES ENTITIES SUCH AS  SOLID PAPILLARY CARCINOMA AS WELL AS ENCAPSULATED PAPILLARY CARCINOMA BOTH OF  WHICH CAN BE CONSIDERED VARIANTS OF DUCTAL CARCINOMA IN SITU EVEN IN THE ABSENCE  OF MYOEPITHELIAL CELLS. WHILE THERE FOCAL AREAS SUSPICIOUS FOR POSSIBLE  INVASION ON THIS OTHERWISE WELL CIRCUMSCRIBED TUMOR DEFINITIVE CLASSIFICATION IS  BEST MADE ON THE EXCISION.   DATE SIGNED OUT: 05/15/2024  ELECTRONIC SIGNATURE : Legolvan Do, Mark, Pathologist, Electronic Signature   MICROSCOPIC DESCRIPTION   CASE COMMENTS  STAINS USED IN DIAGNOSIS:  H&E-2  H&E-3  H&E-4  H&E  *RECUT 1 SLIDE  Stains used in diagnosis 1 ER-ACIS, 1 PR-ACIS  Estrogen receptor (6F11), immunohistochemical stains are performed on formalin  fixed, paraffin embedded tissue using a 3,3-diaminobenzidine (DAB) chromogen  and Leica Bond Autostainer System. The staining intensity of the nucleus is  scored manually and is reported as the percentage of tumor cell nuclei  demonstrating specific nuclear staining.Specimens are fixed in 10% Neutral  Buffered Formalin for at least 6 hours and up to 72 hours. These tests have not  be validated on decalcified tissue. Results should be interpreted with caution  given the possibility of false negative results on decalcified specimens.  PR progesterone receptor (16), immunohistochemical stains are performed on  formalin fixed, paraffin embedded tissue using a 3,3-diaminobenzidine (DAB)  chromogen and Leica Bond Autostainer System. The staining intensity of the  nucleus is scored manually and is reported as the percentage of tumor cell  nuclei demonstrating specific nuclear staining.Specimens are fixed in 10%  Neutral Buffered Formalin for at least 6 hours and up to 72 hours. These tests  have not be  validated on decalcified tissue. Results should be interpreted with  caution given the possibility of false negative results on decalcified  specimens.   ADDENDUM  Breast, left, needle core biopsy  PROGNOSTIC INDICATORS  Results:  IMMUNOHISTOCHEMICAL AND MORPHOMETRIC ANALYSIS PERFORMED MANUALLY  Estrogen Receptor: 95%, positive, strong staining intensity  Progesterone Receptor: 95%, positive, strong staining intensity  COMMENT: The negative hormone receptor study(ies) in this case has an internal positive control.   REFERENCE RANGE ESTROGEN RECEPTOR  NEGATIVE 0%  POSITIVE =>1%  REFERENCE RANGE PROGESTERONE RECEPTOR  NEGATIVE 0%  POSITIVE =>1%  All controls stained appropriately  Pepper Dutton Md, Pathologist, Electronic Signature  [phone])   CLINICAL HISTORY   SPECIMEN(S) OBTAINED  1. Breast, left, needle core biopsy, 11 O'clock, 3cmfn, Venus Clip   SPECIMEN COMMENTS:  1. TIF: 10:59 AM, CIT < 1 minute; 2.1cm mass  SPECIMEN CLINICAL INFORMATION:  1. Benign vs carcinoma   Gross Description  1. Received in formalin labeled left breast 11 o'clock, 3 cm from nipple are  three tan-red fibroadipose tissue cores ranging is size from 0.8 x 0.5 x 0.3 cm  to 1.2 x 0.3 x 0.2 cm. The specimen is entirely submitted in 1 cassette.  TIF: 10:59 a.m. on 05/14/24  CIT: Less than 1 minute  VG 05/14/2024   Assessment and Plan:   Diagnoses and all orders for this visit:  Breast cancer, stage 2, left (CMS/HHS-HCC) - Ambulatory Referral to Oncology-Medical - Ambulatory Referral to Physical Therapy   Previous history of stage I left breast cancer ER positive, PR positive, HER2/neu +2003 status post left breast conserving surgery followed by radiation therapy and chemotherapy. 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  Discussed best option would be a left simple mastectomy with attempted sentinel if no mapping. Which she  agrees to proceed. She will see medical oncology as well as physical therapy  Reviewed risk of bleeding, infection, flap necrosis, wound complications, poor wound healing secondary to radiation exposure, lymphedema, injury to nerves, blood vessels, veins, need for revisional surgery. She has no interest reconstruction.   DEBBY CURTISTINE SHIPPER, MD

## 2024-06-03 NOTE — Op Note (Signed)
 Preop diagnosis: Left breast cancer upper inner quadrant ER positive  Postop diagnosis: Same  Procedure: Left simple mastectomy with deep sentinel lymph node mapping using mag trace  Surgeon: Debby Shipper M.D.  Asst.: None  E.B.L: Minimal  Drains: 19 round  Specimen: Left breast into left axillary sentinel nodes  Indications for procedure: The patient is a 64 year old female with a history of left breast cancer diagnosed by core biopsy. Options of breast conservation surgery and mastectomy were discussed. Risks, benefits, complications and other treatment possibilities were discussed. Reconstruction options were discussed.The surgical and non surgical options have been discussed with the patient.  Risks of surgery include bleeding,  Infection,  Flap necrosis,  Tissue loss,  Chronic pain, death, Numbness,  And the need for additional procedures.  Reconstruction options also have been discussed with the patient as well.  The patient agrees to proceed.   Description of procedure: The patient was seen in the holding area in the left breast was marked.. All questions were answered. The patient agreed to proceed. The patient was taken to the operating room. The patient was placed supine. After induction of general anesthesia, the breast was injected with 4 cc of mag trace in the left upper quadrant  position. Massage was done. The chest was prepped and draped in a sterile fashion. Timeout was done. She received antibiotic prophylaxis. Two curved linear incisions were made above and below the nipple areolar complex. Superior and inferior skin flaps were raised to the clavicle and inferior mammary fold. The breast was then dissected off the chest wall taking the pectoralis fascia with the specimen in a medial to lateral fashion. Upon entering the axilla, the probe was used to identify the sentinel nodes. These were identified by the spike compared to background counts.. They were removed. Background  counts approached baseline and were less than 10% of the injection site counts. The specimens were passed off the field.  A single [employer] drains were placed through separate stab incision to the inferior flap. They were secured to the skin with 3-0 nylon suture. Irrigation was done and hemostasis achieved with TXA soaked sponge. The wound was closed with a deep layer of 3-0 Vicryl interrupted sutures. 3-0 Monocryl was used to close the skin a subcuticular fashion. The drains were placed to suction with good seal. Dermabond was applied to incision. All final counts sponge, instruments and needles were correct.  The patient was awoke and taken to PACU stable condition.

## 2024-06-03 NOTE — Anesthesia Procedure Notes (Signed)
 Anesthesia Regional Block: Pectoralis block   Pre-Anesthetic Checklist: , timeout performed,  Correct Patient, Correct Site, Correct Laterality,  Correct Procedure, Correct Position, site marked,  Risks and benefits discussed,  Surgical consent,  Pre-op evaluation,  At surgeon's request and post-op pain management  Laterality: Left  Prep: chloraprep       Needles:  Injection technique: Single-shot  Needle Type: Stimiplex     Needle Length: 9cm  Needle Gauge: 21     Additional Needles:   Procedures:,,,, ultrasound used (permanent image in chart),,    Narrative:  Start time: 06/03/2024 9:33 AM End time: 06/03/2024 9:38 AM Injection made incrementally with aspirations every 5 mL.  Performed by: Personally  Anesthesiologist: Cleotilde Butler Dade, MD

## 2024-06-04 ENCOUNTER — Encounter (HOSPITAL_BASED_OUTPATIENT_CLINIC_OR_DEPARTMENT_OTHER): Payer: Self-pay | Admitting: Surgery

## 2024-06-08 ENCOUNTER — Inpatient Hospital Stay: Admitting: Hematology and Oncology

## 2024-06-08 ENCOUNTER — Inpatient Hospital Stay

## 2024-06-30 ENCOUNTER — Ambulatory Visit: Payer: Self-pay | Admitting: Rehabilitation
